# Patient Record
Sex: Male | Born: 1970
Health system: Southern US, Community
[De-identification: ages and names within clinical notes are randomized; demographics above are authoritative.]

## PROBLEM LIST (undated history)

## (undated) DIAGNOSIS — E785 Hyperlipidemia, unspecified: Secondary | ICD-10-CM

## (undated) DIAGNOSIS — M109 Gout, unspecified: Secondary | ICD-10-CM

## (undated) DIAGNOSIS — K219 Gastro-esophageal reflux disease without esophagitis: Secondary | ICD-10-CM

## (undated) DIAGNOSIS — G4730 Sleep apnea, unspecified: Secondary | ICD-10-CM

## (undated) DIAGNOSIS — D649 Anemia, unspecified: Secondary | ICD-10-CM

## (undated) DIAGNOSIS — F419 Anxiety disorder, unspecified: Secondary | ICD-10-CM

## (undated) HISTORY — DX: Gastro-esophageal reflux disease without esophagitis: K21.9

## (undated) HISTORY — DX: Sleep apnea, unspecified: G47.30

## (undated) HISTORY — PX: HERNIA REPAIR: SHX51

## (undated) HISTORY — DX: Anemia, unspecified: D64.9

## (undated) HISTORY — DX: Anxiety disorder, unspecified: F41.9

## (undated) HISTORY — PX: CERVICAL FUSION: SHX112

---

## 2005-07-12 ENCOUNTER — Ambulatory Visit: Payer: Self-pay | Admitting: Family Medicine

## 2005-07-31 ENCOUNTER — Ambulatory Visit: Payer: Self-pay | Admitting: Family Medicine

## 2009-04-28 ENCOUNTER — Ambulatory Visit: Payer: Self-pay | Admitting: Family Medicine

## 2009-04-28 DIAGNOSIS — M940 Chondrocostal junction syndrome [Tietze]: Secondary | ICD-10-CM

## 2009-04-29 ENCOUNTER — Encounter: Payer: Self-pay | Admitting: Family Medicine

## 2009-09-20 ENCOUNTER — Ambulatory Visit: Payer: Self-pay | Admitting: Family Medicine

## 2009-09-20 DIAGNOSIS — E785 Hyperlipidemia, unspecified: Secondary | ICD-10-CM

## 2010-01-15 ENCOUNTER — Ambulatory Visit: Payer: Self-pay | Admitting: Emergency Medicine

## 2010-05-08 NOTE — Assessment & Plan Note (Signed)
Summary: SALIVA GLAND SWOLLEN/TJ   Vital Signs:  Patient Profile:   40 Years Old Male CC:      sore mouth/swollen gland X 4 days Height:     68 inches Weight:      245 pounds O2 Sat:      97 % O2 treatment:    Room Air Temp:     97.9 degrees F oral Pulse rate:   72 / minute Resp:     14 per minute BP sitting:   107 / 76  (right arm) Cuff size:   large  Pt. in pain?   yes    Location:   inside left cheeck    Type:       sore  Vitals Entered By: Lajean Saver RN (September 20, 2009 9:43 AM)                   Updated Prior Medication List: ACID REDUCER 10 MG TABS (FAMOTIDINE)  TUMS ULTRA 1000 1000 MG CHEW (CALCIUM CARBONATE ANTACID)  MULTIVITAMINS  TABS (MULTIPLE VITAMIN) once daily * VITMAIN C 1000MG  once dialy  Current Allergies (reviewed today): ! PENICILLINHistory of Present Illness Chief Complaint: sore mouth/swollen gland X 4 days History of Present Illness: Swollen gland on the L side. it is swollen to touch. Started on Saturday and has progressed. He has had a blockage on thr R side before. No sore throat.  Current Problems: ABSCESS OF SALIVARY GLAND (ICD-527.3) HYPERLIPIDEMIA (ICD-272.4) COSTOCHONDRITIS (ICD-733.6)   Current Meds ACID REDUCER 10 MG TABS (FAMOTIDINE)  TUMS ULTRA 1000 1000 MG CHEW (CALCIUM CARBONATE ANTACID)  MULTIVITAMINS  TABS (MULTIPLE VITAMIN) once daily * VITMAIN C 1000MG  once dialy CEFUROXIME AXETIL 500 MG  TABS (CEFUROXIME AXETIL) 1 by mouth 2 times daily  REVIEW OF SYSTEMS Constitutional Symptoms      Denies fever, chills, night sweats, weight loss, weight gain, and fatigue.  Eyes       Denies change in vision, eye pain, eye discharge, glasses, contact lenses, and eye surgery. Ear/Nose/Throat/Mouth       Denies hearing loss/aids, change in hearing, ear pain, ear discharge, dizziness, frequent runny nose, frequent nose bleeds, sinus problems, sore throat, hoarseness, and tooth pain or bleeding.      Comments: sore mouth Respiratory     Denies dry cough, productive cough, wheezing, shortness of breath, asthma, bronchitis, and emphysema/COPD.  Cardiovascular       Denies murmurs, chest pain, and tires easily with exhertion.    Gastrointestinal       Denies stomach pain, nausea/vomiting, diarrhea, constipation, blood in bowel movements, and indigestion. Genitourniary       Denies painful urination, kidney stones, and loss of urinary control. Neurological       Denies paralysis, seizures, and fainting/blackouts. Musculoskeletal       Denies muscle pain, joint pain, joint stiffness, decreased range of motion, redness, swelling, muscle weakness, and gout.  Skin       Denies bruising, unusual mles/lumps or sores, and hair/skin or nail changes.  Psych       Denies mood changes, temper/anger issues, anxiety/stress, speech problems, depression, and sleep problems. Other Comments: Pain over L salivary glands   Past History:  Family History: Last updated: 01/14/2006 DM, stroke, HTN-GM, Mother Colon Ca in her Early 63`s, GM, GF  Social History: Last updated: 09/20/2009 [employer] for [employer].  HS degree.  Married to [place] with 3 children.    some ETOH, no drugs,  2 caffeine/day, no reg exercise.  Current Smoker 1/2PPD X 6-71mo  Risk Factors: Smoking Status: current (09/20/2009)  Past Medical History: Umbilical Hernia Repair  april 2005 Hyperlipidemia  Past Surgical History: Reviewed history from 04/28/2009 and no changes required. Umbilical hernia repair_  Family History: Reviewed history from 01/14/2006 and no changes required. 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  HS degree.  Married to [place] with 3 children.    some ETOH, no drugs,  2 caffeine/day, no reg exercise. Current Smoker 1/2PPD X 6-53mo Smoking Status:  current Physical Exam General appearance: well developed, well nourished,mild distress Head: normocephalic,  atraumatic Ears: normal, no lesions or deformities Oral/Pharynx: tongue normal, posterior pharynx without erythema or exudate swelling over the L parotid gland Neck: neck supple,  trachea midline, no masses Skin: no obvious rashes or lesions MSE: oriented to time, place, and person Assessment New Problems: ABSCESS OF SALIVARY GLAND (ICD-527.3) HYPERLIPIDEMIA (ICD-272.4)  infection of salivary gland  Patient Education: Patient and/or caregiver instructed in the following: rest fluids and Tylenol.  Plan New Medications/Changes: CEFUROXIME AXETIL 500 MG  TABS (CEFUROXIME AXETIL) 1 by mouth 2 times daily  #20 x 0, 09/20/2009, Hassan Rowan MD  New Orders: Est. Patient Level III [phone] Planning Comments:   if not turing around in 48 hrs need ENT follow uip.  Follow Up: Follow up in 2-3 days if no improvement Follow Up: ENT   The patient and/or caregiver has been counseled thoroughly with regard to medications prescribed including dosage, schedule, interactions, rationale for use, and possible side effects and they verbalize understanding.  Diagnoses and expected course of recovery discussed and will return if not improved as expected or if the condition worsens. Patient and/or caregiver verbalized understanding.  Prescriptions: CEFUROXIME AXETIL 500 MG  TABS (CEFUROXIME AXETIL) 1 by mouth 2 times daily  #20 x 0   Entered and Authorized by:   Hassan Rowan MD   Signed by:   Hassan Rowan MD on 09/20/2009   Method used:   Printed then faxed to ...       [address] [phone]* (retail)       [address] [place], [place]  [address]       Ph: [phone]       Fax: [phone]   RxID:   [id number]   Patient Instructions: 1)  Follow up w/ENT if not markedly improved by Friday.  2)  Warm soaks to area 3)  Tobacco is very bad for your health and your loved ones! You Should stop smoking!. 4)  Stop Smoking Tips: Choose a Quit date. Cut down before the Quit date. decide what you  will do as a substitute when you feel the urge to smoke(gum,toothpick,exercise). 5)  Take your antibiotic as prescribed until ALL of it is gone, but stop if you develop a rash or swelling and contact our office as soon as possible. 6)  Recommended remaining out of work for next 2 days  Orders Added: 1)  Est. Patient Level III [44034]

## 2010-05-08 NOTE — Letter (Signed)
Summary: Out of Work  [facility]  [address]   [place], [place] [address]   Phone: [phone]  Fax: [phone]    September 20, 2009   Employee:  [id number]    To Whom It May Concern:   For Medical reasons, please excuse the above named employee from work for the following dates:  Start:   06/15/20111  Return:   09/22/2009  If you need additional information, please feel free to contact our office.         Sincerely,    Hassan Rowan MD

## 2010-05-08 NOTE — Assessment & Plan Note (Signed)
Summary: sinus infection   Vital Signs:  Patient Profile:   40 Years Old Male CC:      Pressure in head and chst, ears hurt, runny nose x 4 days Height:     68 inches Weight:      237 pounds O2 Sat:      99 % O2 treatment:    Room Air Temp:     99.3 degrees F oral Pulse rate:   79 / minute Pulse rhythm:   regular Resp:     16 per minute BP sitting:   133 / 81  (left arm) Cuff size:   regular  Vitals Entered By: Emilio Math (January 15, 2010 8:21 AM)                  Current Allergies (reviewed today): ! PENICILLINHistory of Present Illness Chief Complaint: Pressure in head and chst, ears hurt, runny nose x 4 days History of Present Illness: Patient complains of onset of cold symptoms for 4 days.  They have been using OTC Zycam which is helping a little bit. + sore throat + cough No pleuritic pain No wheezing + nasal congestion + post-nasal drainage + sinus pain/pressure No itchy/red eyes + earache No hemoptysis No SOB + night chills/sweats No fever No nausea No vomiting No abdominal pain No diarrhea No skin rashes No fatigue No myalgias No headache   Current Meds ACID REDUCER 10 MG TABS (FAMOTIDINE)  TUMS ULTRA 1000 1000 MG CHEW (CALCIUM CARBONATE ANTACID)  CEPHALEXIN 500 MG CAPS (CEPHALEXIN) 1 by mouth two times a day for 10 days  REVIEW OF SYSTEMS Constitutional Symptoms      Denies fever, chills, night sweats, weight loss, weight gain, and fatigue.  Eyes       Denies change in vision, eye pain, eye discharge, glasses, contact lenses, and eye surgery. Ear/Nose/Throat/Mouth       Complains of ear pain, frequent runny nose, sinus problems, and sore throat.      Denies hearing loss/aids, change in hearing, ear discharge, dizziness, frequent nose bleeds, hoarseness, and tooth pain or bleeding.  Respiratory       Complains of dry cough.      Denies productive cough, wheezing, shortness of breath, asthma, bronchitis, and emphysema/COPD.   Cardiovascular       Denies murmurs, chest pain, and tires easily with exhertion.    Gastrointestinal       Denies stomach pain, nausea/vomiting, diarrhea, constipation, blood in bowel movements, and indigestion. Genitourniary       Denies painful urination, kidney stones, and loss of urinary control. Neurological       Complains of headaches.      Denies paralysis, seizures, and fainting/blackouts. Musculoskeletal       Denies muscle pain, joint pain, joint stiffness, decreased range of motion, redness, swelling, muscle weakness, and gout.  Skin       Denies bruising, unusual mles/lumps or sores, and hair/skin or nail changes.  Psych       Denies mood changes, temper/anger issues, anxiety/stress, speech problems, depression, and sleep problems.  Past History:  Past Medical History: Reviewed history from 09/20/2009 and no changes required. Umbilical Hernia Repair  april 2005 Hyperlipidemia  Past Surgical History: Reviewed history from 04/28/2009 and no changes required. Umbilical hernia repair_  Family History: Reviewed history from 01/14/2006 and no changes required. DM, stroke, HTN-GM, Mother Colon Ca in her Early 81`s, GM, GF  Social History: Reviewed history from 09/20/2009 and no changes required. 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  HS degree.  Married to [place] with 3 children.    some ETOH, no drugs,  2 caffeine/day, no reg exercise. Current Smoker 1/2PPD X 6-47mo Physical Exam General appearance: well developed, well nourished, no acute distress Head: normocephalic, atraumatic Ears: normal, no lesions or deformities Nasal: mucosa pink, nonedematous, no septal deviation, turbinates normal Oral/Pharynx: tongue normal, posterior pharynx without erythema or exudate Chest/Lungs: no rales, wheezes, or rhonchi bilateral, breath sounds equal without effort Heart: regular rate and  rhythm, no murmur Skin: no obvious rashes or lesions MSE: oriented to time, place, and  person Assessment New Problems: SINUSITIS, ACUTE (ICD-461.9)   Patient Education: Patient and/or caregiver instructed in the following: rest, fluids, Tylenol prn.  Plan New Medications/Changes: CEPHALEXIN 500 MG CAPS (CEPHALEXIN) 1 by mouth two times a day for 10 days  #20 x 0, 01/15/2010, Hoyt Koch MD  New Orders: Est. Patient Level II [phone] Planning Comments:   Suggest OTC Claritin-D because this is likely due to allergies Can use OTC Afrin for 2-3 days Follow-up with your primary care physician if not improving or if getting worse   The patient and/or caregiver has been counseled thoroughly with regard to medications prescribed including dosage, schedule, interactions, rationale for use, and possible side effects and they verbalize understanding.  Diagnoses and expected course of recovery discussed and will return if not improved as expected or if the condition worsens. Patient and/or caregiver verbalized understanding.  Prescriptions: CEPHALEXIN 500 MG CAPS (CEPHALEXIN) 1 by mouth two times a day for 10 days  #20 x 0   Entered and Authorized by:   Hoyt Koch MD   Signed by:   Hoyt Koch MD on 01/15/2010   Method used:   Print then Give to Patient   RxID:   [id number]   Orders Added: 1)  Est. Patient Level II [95621]

## 2010-05-08 NOTE — Assessment & Plan Note (Signed)
Summary: CHEST PAIN/KH   Vital Signs:  Patient Profile:   40 Years Old Male CC:      Chest Pain x 2 weeks, has reflux Height:     68 inches Weight:      256 pounds O2 Sat:      98 % O2 treatment:    Room Air Temp:     99.0 degrees F oral Pulse rate:   80 / minute Pulse rhythm:   regular Resp:     16 per minute BP sitting:   125 / 83  (right arm) Cuff size:   regular  Pt. in pain?   yes    Intensity:   6    Type:       dull  Vitals Entered By: Emilio Math (April 28, 2009 8:31 AM)                   Current Allergies: ! PENICILLINHistory of Present Illness Chief Complaint: Chest Pain x 2 weeks, has reflux History of Present Illness: Subjective:  Patient complains of generally constant ache in his anterior chest for about 2 weeks, worse with movement of his arms, and deep inspiration.  He denies cough, and has had no recent viral illness.  He recalls no recent injury to his chest or change in physical activities.  No fevers, chills, and sweats.  He states that he had a normal cardiac stress test two years ago.  He has a history of GERD, but states that those symptoms are completely different from his present conditon.  He states that the chest discomfort often awakens him at night because cannot find a comfortable position. He has a family history of stroke (maternal GM)  Current Meds TRICOR 48 MG TABS (FENOFIBRATE)  ACID REDUCER 10 MG TABS (FAMOTIDINE)  TUMS ULTRA 1000 1000 MG CHEW (CALCIUM CARBONATE ANTACID)  NAPROXEN 375 MG TABS (NAPROXEN) 1 by mouth q12hr pc TRAMADOL HCL 50 MG TABS (TRAMADOL HCL) One or two tabs by mouth hs as needed for pain  REVIEW OF SYSTEMS Constitutional Symptoms       Complains of weight gain.     Denies fever, chills, night sweats, weight loss, and fatigue.  Eyes       Denies change in vision, eye pain, eye discharge, glasses, contact lenses, and eye surgery. Ear/Nose/Throat/Mouth       Denies hearing loss/aids, change in hearing, ear pain,  ear discharge, dizziness, frequent runny nose, frequent nose bleeds, sinus problems, sore throat, hoarseness, and tooth pain or bleeding.  Respiratory       Denies dry cough, productive cough, wheezing, shortness of breath, asthma, bronchitis, and emphysema/COPD.  Cardiovascular       Complains of chest pain and tires easily with exhertion.      Denies murmurs.    Gastrointestinal       Complains of indigestion.      Denies stomach pain, nausea/vomiting, diarrhea, constipation, and blood in bowel movements. Genitourniary       Denies painful urination, kidney stones, and loss of urinary control. Neurological       Denies paralysis, seizures, and fainting/blackouts. Musculoskeletal       Denies muscle pain, joint pain, joint stiffness, decreased range of motion, redness, swelling, muscle weakness, and gout.  Skin       Denies bruising, unusual mles/lumps or sores, and hair/skin or nail changes.  Psych       Denies mood changes, temper/anger issues, anxiety/stress, speech problems, depression, and sleep problems.  Past History:  Past Medical History: Reviewed history from 01/14/2006 and no changes required. Umbilical Hernia Repair  april 2005  Past Surgical History: Umbilical hernia repair_  Family History: Reviewed history from 01/14/2006 and no changes required. DM, stroke, HTN-GM, Mother Colon Ca in her Early 59`s, GM, GF  Social History: Reviewed history from 01/14/2006 and no changes required. 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  HS degree.  Married to [place] with 3 children.  Occ smokes/uses snuff, some ETOH, no drugs, 2 caffeine/day, no reg exercise.   Objective:  No acute distress, alert and oriented  Eyes:  Pupils are equal, round, and reactive to light and accomdation.  Extraocular movement is intact.  Conjunctivae are not inflamed.  Pharynx:  Normal  Neck:  Supple.  No adenopathy is present.  No thyromegaly is present  Lungs:  Clear to auscultation.  Breath  sounds are equal.  Chest:  Distinct tenderness over the lower one-third of sternum extending into left anterior chest over the left pectoralis muscle. Heart:  Regular rate and rhythm without murmurs, rubs, or gallops.  Abdomen:  Nontender without masses or hepatosplenomegaly.  Bowel sounds are present.  No CVA or flank tenderness.  EKG:  normal Assessment New Problems: COSTOCHONDRITIS (ICD-733.6)   Plan New Medications/Changes: TRAMADOL HCL 50 MG TABS (TRAMADOL HCL) One or two tabs by mouth hs as needed for pain  #20 x 0, 04/28/2009, Donna Christen MD NAPROXEN 375 MG TABS (NAPROXEN) 1 by mouth q12hr pc  #20 x 0, 04/28/2009, Donna Christen MD  New Orders: New Patient Level III [phone] EKG w/ Interpretation [93000] Planning Comments:   Begin NSAID.  Analgesic for bedtime.  Apply heating pad 2 or 3 times daily. Follow-up with PCP if not improving 2 weeks.  Netter info sheet given   The patient and/or caregiver has been counseled thoroughly with regard to medications prescribed including dosage, schedule, interactions, rationale for use, and possible side effects and they verbalize understanding.  Diagnoses and expected course of recovery discussed and will return if not improved as expected or if the condition worsens. Patient and/or caregiver verbalized understanding.  Prescriptions: TRAMADOL HCL 50 MG TABS (TRAMADOL HCL) One or two tabs by mouth hs as needed for pain  #20 x 0   Entered and Authorized by:   Donna Christen MD   Signed by:   Donna Christen MD on 04/28/2009   Method used:   Print then Give to Patient   RxID:   [id number] NAPROXEN 375 MG TABS (NAPROXEN) 1 by mouth q12hr pc  #20 x 0   Entered and Authorized by:   Donna Christen MD   Signed by:   Donna Christen MD on 04/28/2009   Method used:   Print then Give to Patient   RxID:   [id number]

## 2010-06-26 ENCOUNTER — Encounter: Payer: Self-pay | Admitting: Family Medicine

## 2010-06-26 ENCOUNTER — Ambulatory Visit
Admission: RE | Admit: 2010-06-26 | Discharge: 2010-06-26 | Disposition: A | Discharge status: Home / Self Care | Payer: BC Managed Care – PPO | Source: Ambulatory Visit | Attending: Family Medicine | Admitting: Family Medicine

## 2010-06-26 ENCOUNTER — Inpatient Hospital Stay (INDEPENDENT_AMBULATORY_CARE_PROVIDER_SITE_OTHER)
Admission: RE | Admit: 2010-06-26 | Discharge: 2010-06-26 | Disposition: A | Discharge status: Home / Self Care | Payer: BC Managed Care – PPO | Source: Ambulatory Visit | Attending: Family Medicine | Admitting: Family Medicine

## 2010-06-26 ENCOUNTER — Other Ambulatory Visit: Payer: Self-pay | Admitting: Family Medicine

## 2010-06-26 DIAGNOSIS — R05 Cough: Secondary | ICD-10-CM

## 2010-06-26 DIAGNOSIS — J209 Acute bronchitis, unspecified: Secondary | ICD-10-CM

## 2010-06-26 DIAGNOSIS — M940 Chondrocostal junction syndrome [Tietze]: Secondary | ICD-10-CM

## 2010-06-27 ENCOUNTER — Encounter: Payer: Self-pay | Admitting: Family Medicine

## 2010-07-05 NOTE — Letter (Signed)
Summary: Handout Printed  Printed Handout:  - Rheumatic Fever 

## 2010-07-05 NOTE — Assessment & Plan Note (Signed)
Summary: COUGH,SORE THROAT, SOB,CHEST PAIN,SINUS PROBLEMS/TJ rm 4   Vital Signs:  Patient Profile:   40 Years Old Male CC:      chest hurts, cough Height:     68 inches Weight:      252 pounds O2 Sat:      98 % O2 treatment:    Room Air Temp:     99.3 degrees F oral Pulse rate:   96 / minute Resp:     18 per minute BP sitting:   122 / 79  (left arm) Cuff size:   large  Vitals Entered By: Clemens Catholic LPN (June 26, 2010 4:03 PM)                  Updated Prior Medication List: CENTRUM  TABS (MULTIPLE VITAMINS-MINERALS)  Vitamin C Current Allergies: ! PENICILLINHistory of Present Illness Chief Complaint: chest hurts, cough History of Present Illness:  Subjective: Patient complains of persistent sinus congestion for several weeks, then one week ago developed sudden onset myalgias, lethargy, fever/sweats, headache. + cough, partially productive No pleuritic pain, but has pain over the sternum, worse with cough.  This sternal pain has been present for about 2 months. No wheezing + nasal congestion ? post-nasal drainage No sinus pain/pressure No itchy/red eyes No earache but ears feel clogged No hemoptysis No SOB + fever/chills No nausea No vomiting No abdominal pain No diarrhea No skin rashes + fatigue + myalgias  Used OTC meds without relief   REVIEW OF SYSTEMS Constitutional Symptoms       Complains of fever, chills, night sweats, and fatigue.     Denies weight loss and weight gain.  Eyes       Denies change in vision, eye pain, eye discharge, glasses, contact lenses, and eye surgery. Ear/Nose/Throat/Mouth       Complains of frequent runny nose, sinus problems, sore throat, and hoarseness.      Denies hearing loss/aids, change in hearing, ear pain, ear discharge, dizziness, frequent nose bleeds, and tooth pain or bleeding.  Respiratory       Complains of productive cough, wheezing, and shortness of breath.      Denies dry cough, asthma, bronchitis, and  emphysema/COPD.  Cardiovascular       Complains of chest pain and tires easily with exhertion.      Denies murmurs.    Gastrointestinal       Denies stomach pain, nausea/vomiting, diarrhea, constipation, blood in bowel movements, and indigestion. Genitourniary       Denies painful urination, kidney stones, and loss of urinary control. Neurological       Denies paralysis, seizures, and fainting/blackouts. Musculoskeletal       Denies muscle pain, joint pain, joint stiffness, decreased range of motion, redness, swelling, muscle weakness, and gout.  Skin       Denies bruising, unusual mles/lumps or sores, and hair/skin or nail changes.  Psych       Denies mood changes, temper/anger issues, anxiety/stress, speech problems, depression, and sleep problems. Other Comments: pt states that his chest has been hurting in the center through to his back for . he had a sinus infection ago and still has a mostly dry cough and fever. he would like to have a chest xray performed.   Past History:  Past Medical History:  Hyperlipidemia  Past Surgical History: Umbilical hernia repair_2005  Family History: Reviewed history from 01/14/2006 and no changes required. DM, stroke, HTN-GM, Mother Colon Ca in her Early 40`s, GM,  GF  Social History: Reviewed history from 09/20/2009 and no changes required. 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  HS degree.  Married to [place] with 3 children.    some ETOH, no drugs,  2 caffeine/day, no reg exercise. Current Smoker 1/2PPD X 6-44mo   Objective:  No acute distress  Eyes:  Pupils are equal, round, and reactive to light and accomdation.  Extraocular movement is intact.  Conjunctivae are not inflamed.  Ears:  Canals normal.  Tympanic membranes normal, but there may be a mild clear effusion behind left tympanic membrane  Nose:  Congested turbinates.  No sinus tenderness Pharynx:  Normal  Neck:  Supple.  No adenopathy is present.  Lungs:  Clear to  auscultation.  Breath sounds are equal.  Chest:  Distinct tenderness over mid-sternum Heart:  Regular rate and rhythm without murmurs, rubs, or gallops.  Abdomen:  Nontender without masses or hepatosplenomegaly.  Bowel sounds are present.  No CVA or flank tenderness.  Extremities:  No edema. CBC:  WBC 5.7; normal diff; Hgb 13.2 Chest X-ray:  IMPRESSION: No active cardiopulmonary process. Assessment  Assessed COSTOCHONDRITIS as deteriorated - Donna Christen MD New Problems: ACUTE BRONCHITIS (ICD-466.0) COUGH (ICD-786.2)   Plan New Medications/Changes: BENZONATATE 200 MG CAPS (BENZONATATE) One by mouth hs as needed cough  #12 x 0, 06/26/2010, Donna Christen MD NAPROXEN 500 MG TABS (NAPROXEN) One by mouth two times a day pc for chest pain  #20 x 0, 06/26/2010, Donna Christen MD CLARITHROMYCIN 500 MG TABS (CLARITHROMYCIN) One Tab by mouth two times a day  #20 x 0, 06/26/2010, Donna Christen MD  New Orders: CBC w/Diff [[id number]] T-Chest x-ray, 2 views [71020] Pulse Oximetry (single measurment) [94760] Est. Patient Level IV [10272] Planning Comments:   Begin Biaxin, expectorant/decongestant, cough suppressant at bedtime.  Increase fluid intake Begin Naproxen for sternum discomfort.  Given a Water quality scientist patient information and instruction sheet on topic costochondritis Followup with PCP if not improving 7 to 10 days   The patient and/or caregiver has been counseled thoroughly with regard to medications prescribed including dosage, schedule, interactions, rationale for use, and possible side effects and they verbalize understanding.  Diagnoses and expected course of recovery discussed and will return if not improved as expected or if the condition worsens. Patient and/or caregiver verbalized understanding.  Prescriptions: BENZONATATE 200 MG CAPS (BENZONATATE) One by mouth hs as needed cough  #12 x 0   Entered and Authorized by:   Donna Christen MD   Signed by:   Donna Christen MD on 06/26/2010    Method used:   Print then Give to Patient   RxID:   [id number] NAPROXEN 500 MG TABS (NAPROXEN) One by mouth two times a day pc for chest pain  #20 x 0   Entered and Authorized by:   Donna Christen MD   Signed by:   Donna Christen MD on 06/26/2010   Method used:   Print then Give to Patient   RxID:   [id number] CLARITHROMYCIN 500 MG TABS (CLARITHROMYCIN) One Tab by mouth two times a day  #20 x 0   Entered and Authorized by:   Donna Christen MD   Signed by:   Donna Christen MD on 06/26/2010   Method used:   Print then Give to Patient   RxID:   [id number]   Patient Instructions: 1)  Take Mucinex D (guaifenesin with decongestant) twice daily for congestion. 2)  Increase fluid intake, rest. 3)  May use Afrin nasal spray (or generic oxymetazoline) twice daily  for about 5 days.  Also recommend using saline nasal spray several times daily and/or saline nasal irrigation. 4)  Followup with family doctor if not improving 7 to 10 days.   Orders Added: 1)  CBC w/Diff [[id number]] 2)  T-Chest x-ray, 2 views [71020] 3)  Pulse Oximetry (single measurment) [94760] 4)  Est. Patient Level IV [09811]    Laboratory Results  Date/Time Received: June 26, 2010 4:18 PM  Date/Time Reported: June 26, 2010 4:18 PM   Other Tests  Rapid Strep: negative  Kit Test Internal QC: Negative   (Normal Range: Negative)

## 2011-01-15 ENCOUNTER — Inpatient Hospital Stay (INDEPENDENT_AMBULATORY_CARE_PROVIDER_SITE_OTHER)
Admission: RE | Admit: 2011-01-15 | Discharge: 2011-01-15 | Disposition: A | Discharge status: Home / Self Care | Payer: BC Managed Care – PPO | Source: Ambulatory Visit | Attending: Family Medicine | Admitting: Family Medicine

## 2011-01-15 ENCOUNTER — Other Ambulatory Visit: Payer: Self-pay | Admitting: Family Medicine

## 2011-01-15 ENCOUNTER — Encounter: Payer: Self-pay | Admitting: Family Medicine

## 2011-01-15 ENCOUNTER — Ambulatory Visit
Admission: RE | Admit: 2011-01-15 | Discharge: 2011-01-15 | Disposition: A | Discharge status: Home / Self Care | Payer: BC Managed Care – PPO | Source: Ambulatory Visit | Attending: Family Medicine | Admitting: Family Medicine

## 2011-01-15 ENCOUNTER — Telehealth (INDEPENDENT_AMBULATORY_CARE_PROVIDER_SITE_OTHER): Payer: Self-pay | Admitting: *Deleted

## 2011-01-15 DIAGNOSIS — M546 Pain in thoracic spine: Secondary | ICD-10-CM | POA: Insufficient documentation

## 2011-01-15 DIAGNOSIS — M542 Cervicalgia: Secondary | ICD-10-CM

## 2011-01-15 DIAGNOSIS — R209 Unspecified disturbances of skin sensation: Secondary | ICD-10-CM | POA: Insufficient documentation

## 2011-01-15 DIAGNOSIS — M5412 Radiculopathy, cervical region: Secondary | ICD-10-CM

## 2011-03-11 NOTE — Telephone Encounter (Signed)
  Phone Note Outgoing Call   Call placed by: Lajean Saver RN,  January 15, 2011 12:49 PM Call placed to: [facility] Action Taken: Phone Call Completed, Appt scheduled Summary of Call: Referral made per Dr. Rolla Plate request to [facility]. Apt made for 01/21/11 @ 2:30 with Dr. Carole Binning. Patient notified.  Initial call taken by: Lajean Saver RN,  January 15, 2011 12:50 PM

## 2011-03-11 NOTE — Progress Notes (Signed)
Summary: back pain/wb (rm 5)   Vital Signs:  Patient Profile:   40 Years Old Male CC:      upper back pain and right hand numbness x 2 weeks Height:     68 inches Weight:      249 pounds O2 Sat:      97 % O2 treatment:    Room Air Temp:     98.4 degrees F oral Pulse rate:   86 / minute Resp:     16 per minute BP sitting:   138 / 81  (left arm) Cuff size:   large  Pt. in pain?   yes    Location:   upper back    Intensity:   8    Type:       burn/ache  Vitals Entered By: Lajean Saver RN (January 15, 2011 10:49 AM)                   Updated Prior Medication List: CENTRUM  TABS (MULTIPLE VITAMINS-MINERALS)   Current Allergies (reviewed today): ! PENICILLINHistory of Present Illness Chief Complaint: upper back pain and right hand numbness x 2 weeks History of Present Illness:  Subjective:  Patient complains of 2 week history of pain in his right posterior neck and right upper shoulder blade area.  He states that his neck is stiff and he often has a grinding sensation when rotating his neck.  Over the past 3 days he has had a constant tingling sensation in his left thumb and second finger.  He also feels that he is losing strength in his right hand.  He is left-handed.  He is having difficulty sleeping at night.  No cough or chest pain.  No history of neck trauma.  REVIEW OF SYSTEMS Constitutional Symptoms      Denies fever, chills, night sweats, weight loss, weight gain, and fatigue.  Eyes       Denies change in vision, eye pain, eye discharge, glasses, contact lenses, and eye surgery. Ear/Nose/Throat/Mouth       Denies hearing loss/aids, change in hearing, ear pain, ear discharge, dizziness, frequent runny nose, frequent nose bleeds, sinus problems, sore throat, hoarseness, and tooth pain or bleeding.  Respiratory       Denies dry cough, productive cough, wheezing, shortness of breath, asthma, bronchitis, and emphysema/COPD.  Cardiovascular       Denies murmurs, chest  pain, and tires easily with exhertion.    Gastrointestinal       Denies stomach pain, nausea/vomiting, diarrhea, constipation, blood in bowel movements, and indigestion. Genitourniary       Denies painful urination, kidney stones, and loss of urinary control. Neurological       Complains of numbness and tingling.      Denies paralysis, seizures, and fainting/blackouts. Musculoskeletal       Complains of muscle pain.      Denies joint pain, joint stiffness, decreased range of motion, redness, swelling, muscle weakness, and gout.  Skin       Denies bruising, unusual mles/lumps or sores, and hair/skin or nail changes.  Psych       Denies mood changes, temper/anger issues, anxiety/stress, speech problems, depression, and sleep problems. Other Comments: Patient c/o upper right sided back pain x 2 weeks. Nubness in right hand/fingers developing. Taken ibuprofen and tylenol OTC. Unknown cause of pain   Past History:  Past Medical History: Reviewed history from 06/26/2010 and no changes required.  Hyperlipidemia  Past Surgical History: Reviewed history  from 06/26/2010 and no changes required. Umbilical hernia repair_2005  Family History: Reviewed history from 01/14/2006 and no changes required. DM, stroke, HTN-GM, Mother Colon Ca in her Early 62`s, GM, GF  Social History: Reviewed history from 06/26/2010 and no changes required. 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  HS degree.  Married to [place] with 3 children.    some ETOH, no drugs,  2 caffeine/day, no reg exercise. Current Smoker 1/2PPD X 6-56mo   Objective:  Husky middle aged male in no acute distress  Eyes:  Pupils are equal, round, and reactive to light and accomodation.  Extraocular movement is intact.  Conjunctivae are not inflamed.  Neck:  Supple.  No adenopathy is present.  No thyromegaly is present.  Mildly decreased range of motion.  No tenderness to palpation Lungs:  Clear to auscultation.  Breath sounds are equal.  Heart:   Regular rate and rhythm without murmurs, rubs, or gallops.  Back (upper):  No tenderness over right scapula Right arm:  Full range of motion all joints.  No shoulder, elbow, wrist, or hand tenderness.  Right thumb and second finger have normal sensation to light touch.   Decreased grip strength right hand. X-ray C-spine:   IMPRESSION: No acute bony abnormality.  Right foraminal narrowing, most prominent at C3-4.  Assessment New Problems: CERVICAL RADICULOPATHY (ICD-723.4) DISTURBANCE OF SKIN SENSATION (ICD-782.0) BACK PAIN, THORACIC REGION, RIGHT (ICD-724.1) NECK PAIN, RIGHT (ICD-723.1)   Plan New Medications/Changes: TRAMADOL HCL 50 MG TABS (TRAMADOL HCL) One or two tabs by mouth hs as needed for pain  #20 x 0, 01/15/2011, Donna Christen MD PREDNISONE 10 MG TABS (PREDNISONE) 2 PO BID for 3 days, then 1 BID for 2 days, then 1 daily for 2 days.  Take PC  #18 x 0, 01/15/2011, Donna Christen MD  New Orders: T-DG Cervical Spine Complete 5 views [72050] Est. Patient Level IV [16109] Neurology Referral [Neuro] Planning Comments:   Begin tapering course of prednisone.  Tramadol at bedtime. Refer to neurologist:  [facility] in [place]   The patient and/or caregiver has been counseled thoroughly with regard to medications prescribed including dosage, schedule, interactions, rationale for use, and possible side effects and they verbalize understanding.  Diagnoses and expected course of recovery discussed and will return if not improved as expected or if the condition worsens. Patient and/or caregiver verbalized understanding.  Prescriptions: TRAMADOL HCL 50 MG TABS (TRAMADOL HCL) One or two tabs by mouth hs as needed for pain  #20 x 0   Entered and Authorized by:   Donna Christen MD   Signed by:   Donna Christen MD on 01/15/2011   Method used:   Print then Give to Patient   RxID:   [id number] PREDNISONE 10 MG TABS (PREDNISONE) 2 PO BID for 3 days, then 1 BID for 2 days, then 1  daily for 2 days.  Take PC  #18 x 0   Entered and Authorized by:   Donna Christen MD   Signed by:   Donna Christen MD on 01/15/2011   Method used:   Print then Give to Patient   RxID:   [id number]   Orders Added: 1)  T-DG Cervical Spine Complete 5 views [72050] 2)  Est. Patient Level IV [29528] 3)  Neurology Referral [Neuro]

## 2011-08-12 ENCOUNTER — Encounter: Payer: Self-pay | Admitting: *Deleted

## 2011-08-12 ENCOUNTER — Emergency Department
Admission: EM | Admit: 2011-08-12 | Discharge: 2011-08-12 | Disposition: A | Discharge status: Home / Self Care | Payer: BC Managed Care – PPO | Source: Home / Self Care

## 2011-08-12 DIAGNOSIS — R05 Cough: Secondary | ICD-10-CM

## 2011-08-12 DIAGNOSIS — J329 Chronic sinusitis, unspecified: Secondary | ICD-10-CM

## 2011-08-12 HISTORY — DX: Hyperlipidemia, unspecified: E78.5

## 2011-08-12 MED ORDER — ALBUTEROL SULFATE HFA 108 (90 BASE) MCG/ACT IN AERS: 1.0000 | INHALATION_SPRAY | Freq: Four times a day (QID) | RESPIRATORY_TRACT | Status: DC | PRN

## 2011-08-12 MED ORDER — AZITHROMYCIN 250 MG PO TABS: ORAL_TABLET | ORAL | Status: AC

## 2011-08-12 NOTE — ED Provider Notes (Signed)
Agree with exam, assessment, and plan.   Lattie Haw, MD 08/12/11 [phone]

## 2011-08-12 NOTE — Discharge Instructions (Signed)

## 2011-08-12 NOTE — ED Provider Notes (Signed)
History     CSN: [id number]  Arrival date & time 08/12/11  1257   None     Chief Complaint  Patient presents with  . Sinus Problem    (Consider location/radiation/quality/duration/timing/severity/associated sxs/prior treatment) Patient is a 41 y.o. male presenting with sinus complaint.  Sinus Problem   Dakota Garcia is a 41 y.o. male who complains of onset of uri for 2 weeks, states three days ago began to have facial pain/headache, left side worse than right.  No sore throat + cough, non productive No pleuritic pain + wheezing No nasal congestion + post-nasal drainage + sinus pain/pressure No chest congestion No itchy/red eyes L earache No hemoptysis No SOB + chills/sweats No fever No nausea No vomiting No abdominal pain No diarrhea No skin rashes No fatigue No myalgias No headache   Past Medical History  Diagnosis Date  . Hyperlipidemia     Past Surgical History  Procedure Date  . Hernia repair   . Cervical fusion     No family history on file.  History  Substance Use Topics  . Smoking status: Never Smoker   . Smokeless tobacco: Not on file  . Alcohol Use: Yes      Review of Systems  All other systems reviewed and are negative.    Allergies  Penicillins  Home Medications   Current Outpatient Rx  Name Route Sig Dispense Refill  . OMEGA-3 FATTY ACIDS 1000 MG PO CAPS Oral Take 2 g by mouth daily.    . ALBUTEROL SULFATE HFA 108 (90 BASE) MCG/ACT IN AERS Inhalation Inhale 1-2 puffs into the lungs every 6 (six) hours as needed for wheezing. 1 Inhaler 0  . AZITHROMYCIN 250 MG PO TABS  Azithromycin 500mg  on day 1, then 250mg  on days 2-4  (RX expires 08/27/11) 6 tablet 0    BP 125/84  Pulse 107  Temp(Src) 98.8 F (37.1 C) (Oral)  Resp 16  Ht 5\' 8"  (1.727 m)  Wt 258 lb (117.028 kg)  BMI 39.23 kg/m2  SpO2 97%  Physical Exam  Constitutional: He is oriented to person, place, and time. Vital signs are normal. He appears well-developed and  well-nourished. He is active and cooperative.  HENT:  Head: Normocephalic.  Right Ear: Hearing, tympanic membrane, external ear and ear canal normal.  Left Ear: Hearing, external ear and ear canal normal. Tympanic membrane is bulging.  Nose: Nose normal. Right sinus exhibits no maxillary sinus tenderness and no frontal sinus tenderness. Left sinus exhibits no maxillary sinus tenderness and no frontal sinus tenderness.  Mouth/Throat: Uvula is midline, oropharynx is clear and moist and mucous membranes are normal.  Eyes: Conjunctivae and EOM are normal. Pupils are equal, round, and reactive to light. No scleral icterus.  Neck: Trachea normal. Neck supple.  Cardiovascular: Normal rate, regular rhythm, normal heart sounds and normal pulses.   Pulmonary/Chest: Effort normal and breath sounds normal. He exhibits no tenderness.  Lymphadenopathy:    He has no cervical adenopathy.       Left submandibular and tonsillar tenderness  Neurological: He is alert and oriented to person, place, and time. No cranial nerve deficit or sensory deficit.  Skin: Skin is warm and dry.  Psychiatric: He has a normal mood and affect. His speech is normal and behavior is normal. Judgment and thought content normal. Cognition and memory are normal.    ED Course  Procedures (including critical care time)  Labs Reviewed - No data to display No results found.   1. Sinusitis  2. Cough       MDM  Increase fluid intake, rest.  Begin Azithromycin if symptoms not improved with discussed measures in 3-4 days.  Begin expectorant/decongestant, topical decongestant, saline nasal spray and/or saline irrigation, and cough suppressant at bedtime. Antihistamines of your choice (Claritin or Zyrtec) in 3 days.  Tylenol or Motrin for fever/discomfort.  I believe you may have early bronchitis, began Albuterol for wheezing and cough.          Johnsie Kindred, NP 08/12/11 1337

## 2011-08-12 NOTE — ED Notes (Signed)
Sinus pressure and drainage x 2 weeks. Used sudafed and mucinex

## 2011-12-06 ENCOUNTER — Emergency Department
Admission: EM | Admit: 2011-12-06 | Discharge: 2011-12-06 | Disposition: A | Discharge status: Home / Self Care | Payer: BC Managed Care – PPO | Source: Home / Self Care

## 2011-12-06 ENCOUNTER — Encounter: Payer: Self-pay | Admitting: *Deleted

## 2011-12-06 ENCOUNTER — Emergency Department (INDEPENDENT_AMBULATORY_CARE_PROVIDER_SITE_OTHER): Payer: BC Managed Care – PPO

## 2011-12-06 ENCOUNTER — Ambulatory Visit (INDEPENDENT_AMBULATORY_CARE_PROVIDER_SITE_OTHER): Payer: BC Managed Care – PPO | Admitting: Sports Medicine

## 2011-12-06 DIAGNOSIS — M79609 Pain in unspecified limb: Secondary | ICD-10-CM

## 2011-12-06 DIAGNOSIS — M79672 Pain in left foot: Secondary | ICD-10-CM

## 2011-12-06 DIAGNOSIS — M25579 Pain in unspecified ankle and joints of unspecified foot: Secondary | ICD-10-CM

## 2011-12-06 DIAGNOSIS — M79673 Pain in unspecified foot: Secondary | ICD-10-CM

## 2011-12-06 DIAGNOSIS — M109 Gout, unspecified: Secondary | ICD-10-CM | POA: Insufficient documentation

## 2011-12-06 DIAGNOSIS — M7730 Calcaneal spur, unspecified foot: Secondary | ICD-10-CM

## 2011-12-06 MED ORDER — PREDNISONE 50 MG PO TABS: ORAL_TABLET | ORAL | Status: DC

## 2011-12-06 NOTE — ED Notes (Signed)
Patient c/o left foot pain x 3 days. No injury. Hx of possible gout.

## 2011-12-06 NOTE — ED Provider Notes (Signed)
History     CSN: [id number]  Arrival date & time 12/06/11  1045   First MD Initiated Contact with Patient 12/06/11 1108      Chief Complaint  Patient presents with  . Foot Pain    left foot   HPI  Patient presents today with chief complaint of left foot and ankle pain x2-3 days. Patient is unsure of any known inciting trauma for this. Patient does report that he has a prior history of left great toe podagra in the past. This incident occurred about 1-2 years ago. Patient states that he has noticed lateral ankle and lateral foot pain initially while at work. And this has persisted. Patient denies any numbness or tingling associated with this pain. Patient states he's been able to bear weight on his foot with mild pain. However, pain has progressively worse the past 2 days. Mild swelling and redness. Patient states that this current foot and ankle pain is not similar to his previous episode of podagra, but he is unsure if this is related to his gout or not. Patient states that he is not physically active. Patient does report standing on his feet all day. Patient states that he did eat barbecue pork last night and woke up this morning with significantly worsening lateral ankle and foot pain.   Past Medical History  Diagnosis Date  . Hyperlipidemia     Past Surgical History  Procedure Date  . Hernia repair   . Cervical fusion     Family History  Problem Relation Age of Onset  . Cancer Mother     colon  . Gout Father     History  Substance Use Topics  . Smoking status: Former Games developer  . Smokeless tobacco: Not on file  . Alcohol Use: Yes      Review of Systems  All other systems reviewed and are negative.    Allergies  Penicillins  Home Medications   Current Outpatient Rx  Name Route Sig Dispense Refill  . ALBUTEROL SULFATE HFA 108 (90 BASE) MCG/ACT IN AERS Inhalation Inhale 1-2 puffs into the lungs every 6 (six) hours as needed for wheezing. 1 Inhaler 0  . OMEGA-3  FATTY ACIDS 1000 MG PO CAPS Oral Take 2 g by mouth daily.      BP 116/84  Pulse 95  Temp 98 F (36.7 C) (Oral)  Resp 14  Ht 5\' 9"  (1.753 m)  Wt 253 lb (114.76 kg)  BMI 37.36 kg/m2  SpO2 97%  Physical Exam  Constitutional: He appears well-developed.       Obese    HENT:  Head: Normocephalic and atraumatic.  Eyes: Conjunctivae are normal. Pupils are equal, round, and reactive to light.  Neck: Normal range of motion.  Cardiovascular: Normal rate and regular rhythm.   Pulmonary/Chest: Effort normal and breath sounds normal.  Abdominal: Soft. Bowel sounds are normal.  Musculoskeletal:       Ankle: Mild lateral ankle swelling.  + tenderness over N spot and lateral malleolar distribution.  + Pain with plantar flexion, inversion, eversion.  Strength is 5/5 in all directions. Stable lateral and medial ligaments; squeeze test and kleiger test unremarkable;  Talar dome nontender; No pain at base of 5th MT; No tenderness over cuboid; No sign of peroneal tendon subluxations; Negative tarsal tunnel tinel's Able to walk 4 steps. Noted TTP over plantar aspect 4th-5th metatarsal base.      ED Course  Procedures (including critical care time)  Labs Reviewed - No data  to display Dg Ankle Complete Left  12/06/2011  *RADIOLOGY REPORT*  Clinical Data: Left foot and ankle pain.  No known injury.  LEFT ANKLE COMPLETE - 3+ VIEW  Comparison: Left foot radiographs obtained at the same time.  Findings: Previously described posterior calcaneal spur. Otherwise, normal appearing bones and soft tissues.  IMPRESSION: Posterior calcaneal spur.  Otherwise, normal examination.   Original Report Authenticated By: Darrol Angel, M.D.    Dg Foot Complete Left  12/06/2011  *RADIOLOGY REPORT*  Clinical Data: Left foot and ankle pain.  No known injury.  LEFT FOOT - COMPLETE 3+ VIEW  Comparison: None.  Findings: Small to moderate sized posterior calcaneal spur. Otherwise, normal appearing bones and soft  tissues.  IMPRESSION: Posterior calcaneal spur.  Otherwise, normal examination.   Original Report Authenticated By: Darrol Angel, M.D.      1. Foot pain   2. Ankle pain       MDM  While this is atypical, I suspect this may be a gout flare. Patient does have a prior history of gout as well as a worsening of pain after eating a meal rich in uric acid (barbecue pork). Will place patient on course of prednisone for diagnostic and therapeutic evaluation. Differential diagnosis also includes metatarsalgia, arthritis, and Morton's neuroma. X-rays are negative for any type of fracture although there is some degenerative changes along the talar dome. Plan for patient to followup with sports medicine for reevaluation the next 1-2 weeks. Discussed with patient general red flags for reevaluation including worsening pain, fever, chills. Sports medicine was also formally consulted for evaluation during this visit.     The patient and/or caregiver has been counseled thoroughly with regard to treatment plan and/or medications prescribed including dosage, schedule, interactions, rationale for use, and possible side effects and they verbalize understanding. Diagnoses and expected course of recovery discussed and will return if not improved as expected or if the condition worsens. Patient and/or caregiver verbalized understanding.              Doree Albee, MD 12/06/11 [phone]

## 2011-12-06 NOTE — Assessment & Plan Note (Signed)
I think that certainly possible he is having a flare of gout in his intertarsal joints. This would not be a classic location. 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 I do agree with Dr. Alvester Morin with a course of prednisone. He can come back to see me afterwards. We did discuss custom orthotics, and as he does spend all day on his feet, I think these would be useful.

## 2011-12-06 NOTE — Progress Notes (Signed)
Patient ID: Dakota Garcia, male   DOB: Nov 28, 1970, 41 y.o.   MRN: [id number] Subjective:    I'm seeing this patient as a consultation for:  Dr. Alvester Morin  CC: Left foot pain  HPI: Bishoy is a very pleasant 41 year old male who, without trauma, and for the past few days has noted significant pain over the dorsum of his left foot. He's also noted a small amount of swelling. The pain is localized, does not radiate, and he notes the does not feel like prior episodes of gout. He does note that he had some insoles at some point, that were particularly efficacious for his pain.  He does not have a primary physician, and he doesn't have anyone as follows his gout. He is unaware of his previous uric acid levels.  Past medical history, Surgical history, Family history, Social history, Allergies, and medications have been entered into the medical record, reviewed, and no changes needed.   Review of Systems: No headache, visual changes, nausea, vomiting, diarrhea, constipation, dizziness, abdominal pain, skin rash, fevers, chills, night sweats, weight loss, body aches, joint swelling, muscle aches, chest pain, or shortness of breath.   Objective:   Vitals:  Afebrile, vital signs stable. General: Well Developed, well nourished, and in no acute distress.  Neuro: Alert and oriented x3, extra-ocular muscles intact.  Skin: Warm and dry, no rashes noted.  Respiratory: Not using accessory muscles, speaking in full sentences.  Cardiovascular: Pulses palpable, no extremity edema. Left Ankle: No visible erythema or swelling. Range of motion is full in all directions. Strength is 5/5 in all directions. Stable lateral and medial ligaments; squeeze test and kleiger test unremarkable; Talar dome nontender; No pain at base of 5th MT;  No tenderness over N spot or navicular prominence No tenderness on posterior aspects of lateral and medial malleolus No sign of peroneal tendon subluxations or tenderness to  palpation Negative tarsal tunnel tinel's He is exquisitely tender to palpation over what feels like the extensor digitorum longus tendon, at the level of the metatarsal cuboid joint. He has pain with resisted eversion, and pain with passive flexion of toes 2 through 5.  I did review x-rays of his ankle and foot personally, they show no sign of fracture, or dislocation. There is no degenerative change noted at the metatarsal cuboid joint. There is possibly some slight degenerative change at the tibiotalar joint. Otherwise unremarkable.  Impression and Recommendations:

## 2011-12-13 ENCOUNTER — Ambulatory Visit: Payer: BC Managed Care – PPO | Admitting: Sports Medicine

## 2011-12-13 ENCOUNTER — Telehealth: Payer: Self-pay | Admitting: Sports Medicine

## 2011-12-13 MED ORDER — MELOXICAM 15 MG PO TABS: ORAL_TABLET | ORAL | Status: AC

## 2011-12-13 NOTE — Telephone Encounter (Signed)
Pain inadequately controlled with prednisone. We'll add Mobic. He will come to see me next week, at that point I suspect we will have to perform an intra-articular injection.

## 2011-12-13 NOTE — Telephone Encounter (Signed)
PT WOULD LIKE MOBIC CALLED IN TO [facility] IN [place].

## 2011-12-16 ENCOUNTER — Ambulatory Visit (INDEPENDENT_AMBULATORY_CARE_PROVIDER_SITE_OTHER): Payer: BC Managed Care – PPO | Admitting: Sports Medicine

## 2011-12-16 ENCOUNTER — Encounter: Payer: Self-pay | Admitting: Sports Medicine

## 2011-12-16 VITALS — BP 127/79 | HR 103 | Temp 97.3°F | Wt 254.0 lb

## 2011-12-16 DIAGNOSIS — M79672 Pain in left foot: Secondary | ICD-10-CM

## 2011-12-16 DIAGNOSIS — G4730 Sleep apnea, unspecified: Secondary | ICD-10-CM

## 2011-12-16 DIAGNOSIS — M79609 Pain in unspecified limb: Secondary | ICD-10-CM

## 2011-12-16 DIAGNOSIS — E785 Hyperlipidemia, unspecified: Secondary | ICD-10-CM

## 2011-12-16 DIAGNOSIS — R269 Unspecified abnormalities of gait and mobility: Secondary | ICD-10-CM

## 2011-12-16 MED ORDER — RED YEAST RICE EXTRACT POWD: Status: DC

## 2011-12-16 MED ORDER — PLANT STEROLS AND STANOLS 450 MG PO TABS: ORAL_TABLET | ORAL | Status: DC

## 2011-12-16 NOTE — Assessment & Plan Note (Signed)
He will come back to see me for custom orthotics.

## 2011-12-16 NOTE — Assessment & Plan Note (Signed)
Symptoms are likely related to gout. Will have him check a uric acid. Goal will be less than 5.

## 2011-12-16 NOTE — Assessment & Plan Note (Signed)
Continue diet modification, red rice yeast extract, and plant sterols/stanols We will go ahead and check a fasting lipid panel.

## 2011-12-16 NOTE — Progress Notes (Addendum)
Patient ID: Dakota Garcia, male   DOB: Aug 19, 1970, 41 y.o.   MRN: [id number] Subjective:    CC: Establish care, followup ankle pain  HPI: Ankle pain: Seen in the urgent care, as the consult. He was placed on prednisone, this resolved his symptoms. Presumptive diagnosis was gout.  He's had a prior episode of podagra, but has no physician diagnosis of gout.  Hyper lipidemia: Known hypertriglyceridemia, has been on multiple statins, as well as TriCor. More recently, he went off all of his prescription medicines, and the diet modification, as well as red rice yeast extract. This controlled his triglycerides well. He desires to continue natural methods of lipid control.  Foot pain: Present throughout the day, has never had custom orthotics.  Apnea: Greysen is having a large amount of snoring, daytime sleepiness.  Past medical history, Surgical history, Family history, Social history, Allergies, and medications have been entered into the medical record, reviewed, and no changes needed.   Review of Systems: No fevers, chills, night sweats, weight loss, chest pain, or shortness of breath.   Objective:    General: Well Developed, well nourished, and in no acute distress.  Neuro: Alert and oriented x3, extra-ocular muscles intact.  HEENT: Normocephalic, atraumatic, pupils equal round reactive to light, neck supple, no masses, no lymphadenopathy, thyroid nonpalpable.  Skin: Warm and dry, no rashes. Cardiac: Regular rate and rhythm, no murmurs rubs or gallops.  Respiratory: Clear to auscultation bilaterally. Not using accessory muscles, speaking in full sentences. Left Ankle: No visible erythema or swelling. Range of motion is full in all directions. Strength is 5/5 in all directions. Stable lateral and medial ligaments; squeeze test and kleiger test unremarkable; Talar dome nontender; No pain at base of 5th MT; No tenderness over cuboid; No tenderness over N spot or navicular prominence No  tenderness on posterior aspects of lateral and medial malleolus No sign of peroneal tendon subluxations or tenderness to palpation Negative tarsal tunnel tinel's Able to walk 4 steps. Cavus foot bilaterally.  Impression and Recommendations:

## 2011-12-16 NOTE — Assessment & Plan Note (Signed)
We'll order sleep study with CPAP titration.

## 2011-12-17 ENCOUNTER — Telehealth: Payer: Self-pay

## 2011-12-17 DIAGNOSIS — R0683 Snoring: Secondary | ICD-10-CM

## 2011-12-17 NOTE — Telephone Encounter (Signed)
See last office note 

## 2011-12-25 ENCOUNTER — Telehealth: Payer: Self-pay | Admitting: Sports Medicine

## 2011-12-25 NOTE — Telephone Encounter (Signed)
I need to know if the referral that is in the system for neurology is for ?sleep apnea.  You have snoring so I wanted to know if you want him tested for sleep apnea or to see a doctor?  thanks

## 2011-12-25 NOTE — Telephone Encounter (Signed)
Yeah that should not be a referral to neurology, it should be a referral for a sleep study.  He doesn't need to see another physician yet.

## 2011-12-26 ENCOUNTER — Other Ambulatory Visit: Payer: Self-pay | Admitting: Sports Medicine

## 2011-12-26 DIAGNOSIS — G4730 Sleep apnea, unspecified: Secondary | ICD-10-CM

## 2012-01-10 LAB — LIPID PANEL
Cholesterol: 221 mg/dL — ABNORMAL HIGH (ref 0–200)
HDL: 35 mg/dL — ABNORMAL LOW (ref >39)
Total CHOL/HDL Ratio: 6.3 Ratio
Triglycerides: 704 mg/dL — ABNORMAL HIGH (ref <150)

## 2012-01-10 LAB — URIC ACID: Uric Acid, Serum: 9.3 mg/dL — ABNORMAL HIGH (ref 4.0–7.8)

## 2012-01-13 ENCOUNTER — Encounter: Payer: Self-pay | Admitting: Sports Medicine

## 2012-01-13 MED ORDER — FENOFIBRATE 145 MG PO TABS: ORAL_TABLET | ORAL | Status: DC

## 2012-01-13 MED ORDER — ALLOPURINOL 300 MG PO TABS: 300.0000 mg | ORAL_TABLET | Freq: Every day | ORAL | Status: DC

## 2012-01-13 NOTE — Addendum Note (Signed)
Addended by: Monica Becton on: 01/13/2012 02:58 PM   Modules accepted: Orders

## 2013-03-16 ENCOUNTER — Other Ambulatory Visit: Payer: Self-pay | Admitting: Sports Medicine

## 2013-07-10 ENCOUNTER — Other Ambulatory Visit: Payer: Self-pay | Admitting: Sports Medicine

## 2013-08-06 ENCOUNTER — Encounter: Payer: Self-pay | Admitting: Emergency Medicine

## 2013-08-06 ENCOUNTER — Emergency Department
Admission: EM | Admit: 2013-08-06 | Discharge: 2013-08-06 | Disposition: A | Discharge status: Home / Self Care | Payer: BC Managed Care – PPO | Source: Home / Self Care | Attending: Family Medicine | Admitting: Family Medicine

## 2013-08-06 DIAGNOSIS — M10071 Idiopathic gout, right ankle and foot: Secondary | ICD-10-CM

## 2013-08-06 DIAGNOSIS — M109 Gout, unspecified: Secondary | ICD-10-CM

## 2013-08-06 HISTORY — DX: Gout, unspecified: M10.9

## 2013-08-06 MED ORDER — HYDROCODONE-ACETAMINOPHEN 5-325 MG PO TABS: ORAL_TABLET | ORAL | Status: DC

## 2013-08-06 MED ORDER — PREDNISONE 20 MG PO TABS: 20.0000 mg | ORAL_TABLET | Freq: Two times a day (BID) | ORAL | Status: DC

## 2013-08-06 NOTE — Discharge Instructions (Signed)
Increase fluid intake.  Recommend follow-up with Dr. Aundria Mems for management of uric acid and allopurinol dose.   Gout Gout is an inflammatory arthritis caused by a buildup of uric acid crystals in the joints. Uric acid is a chemical that is normally present in the blood. When the level of uric acid in the blood is too high it can form crystals that deposit in your joints and tissues. This causes joint redness, soreness, and swelling (inflammation). Repeat attacks are common. Over time, uric acid crystals can form into masses (tophi) near a joint, destroying bone and causing disfigurement. Gout is treatable and often preventable. CAUSES  The disease begins with elevated levels of uric acid in the blood. Uric acid is produced by your body when it breaks down a naturally found substance called purines. Certain foods you eat, such as meats and fish, contain high amounts of purines. Causes of an elevated uric acid level include:  Being passed down from parent to child (heredity).  Diseases that cause increased uric acid production (such as obesity, psoriasis, and certain cancers).  Excessive alcohol use.  Diet, especially diets rich in meat and seafood.  Medicines, including certain cancer-fighting medicines (chemotherapy), water pills (diuretics), and aspirin.  Chronic kidney disease. The kidneys are no longer able to remove uric acid well.  Problems with metabolism. Conditions strongly associated with gout include:  Obesity.  High blood pressure.  High cholesterol.  Diabetes. Not everyone with elevated uric acid levels gets gout. It is not understood why some people get gout and others do not. Surgery, joint injury, and eating too much of certain foods are some of the factors that can lead to gout attacks. SYMPTOMS   An attack of gout comes on quickly. It causes intense pain with redness, swelling, and warmth in a joint.  Fever can occur.  Often, only one joint is  involved. Certain joints are more commonly involved:  Base of the big toe.  Knee.  Ankle.  Wrist.  Finger. Without treatment, an attack usually goes away in a few days to weeks. Between attacks, you usually will not have symptoms, which is different from many other forms of arthritis. DIAGNOSIS  Your caregiver will suspect gout based on your symptoms and exam. In some cases, tests may be recommended. The tests may include:  Blood tests.  Urine tests.  X-rays.  Joint fluid exam. This exam requires a needle to remove fluid from the joint (arthrocentesis). Using a microscope, gout is confirmed when uric acid crystals are seen in the joint fluid. TREATMENT  There are two phases to gout treatment: treating the sudden onset (acute) attack and preventing attacks (prophylaxis).  Treatment of an Acute Attack.  Medicines are used. These include anti-inflammatory medicines or steroid medicines.  An injection of steroid medicine into the affected joint is sometimes necessary.  The painful joint is rested. Movement can worsen the arthritis.  You may use warm or cold treatments on painful joints, depending which works best for you.  Treatment to Prevent Attacks.  If you suffer from frequent gout attacks, your caregiver may advise preventive medicine. These medicines are started after the acute attack subsides. These medicines either help your kidneys eliminate uric acid from your body or decrease your uric acid production. You may need to stay on these medicines for a very long time.  The early phase of treatment with preventive medicine can be associated with an increase in acute gout attacks. For this reason, during the first few months  of treatment, your caregiver may also advise you to take medicines usually used for acute gout treatment. Be sure you understand your caregiver's directions. Your caregiver may make several adjustments to your medicine dose before these medicines are  effective.  Discuss dietary treatment with your caregiver or dietitian. Alcohol and drinks high in sugar and fructose and foods such as meat, poultry, and seafood can increase uric acid levels. Your caregiver or dietician can advise you on drinks and foods that should be limited. HOME CARE INSTRUCTIONS   Do not take aspirin to relieve pain. This raises uric acid levels.  Only take over-the-counter or prescription medicines for pain, discomfort, or fever as directed by your caregiver.  Rest the joint as much as possible. When in bed, keep sheets and blankets off painful areas.  Keep the affected joint raised (elevated).  Apply warm or cold treatments to painful joints. Use of warm or cold treatments depends on which works best for you.  Use crutches if the painful joint is in your leg.  Drink enough fluids to keep your urine clear or pale yellow. This helps your body get rid of uric acid. Limit alcohol, sugary drinks, and fructose drinks.  Follow your dietary instructions. Pay careful attention to the amount of protein you eat. Your daily diet should emphasize fruits, vegetables, whole grains, and fat-free or low-fat milk products. Discuss the use of coffee, vitamin C, and cherries with your caregiver or dietician. These may be helpful in lowering uric acid levels.  Maintain a healthy body weight. SEEK MEDICAL CARE IF:   You develop diarrhea, vomiting, or any side effects from medicines.  You do not feel better in 24 hours, or you are getting worse. SEEK IMMEDIATE MEDICAL CARE IF:   Your joint becomes suddenly more tender, and you have chills or a fever. MAKE SURE YOU:   Understand these instructions.  Will watch your condition.  Will get help right away if you are not doing well or get worse. Document Released: 03/22/2000 Document Revised: 07/20/2012 Document Reviewed: 11/06/2011 [facility] Patient Information 2014 [place].

## 2013-08-06 NOTE — ED Notes (Signed)
Gout in right ankle back of foot started yesterday 10/10

## 2013-08-06 NOTE — ED Provider Notes (Signed)
CSN: [id number]     Arrival date & time 08/06/13  0920 History   First MD Initiated Contact with Patient 08/06/13 [phone]     Chief Complaint  Patient presents with  . Gout      HPI Comments: Patient developed onset of pain and swelling in the back of his foot at about 4pm yesterday.  The pain kept him awake last night.  No response to ibuprofen 400mg .  He has not had a gout attack recently.  No recent change in activities or diet.  He reports that he started on a new bottle of allopurinol several days ago.  He feels well otherwise.  Patient is a 43 y.o. male presenting with lower extremity pain. The history is provided by the patient.  Foot Pain This is a new problem. The current episode started yesterday. The problem occurs constantly. The problem has not changed since onset.Associated symptoms comments: none. The symptoms are aggravated by walking. Nothing relieves the symptoms. Treatments tried: ibuprofen. The treatment provided mild relief.    Past Medical History  Diagnosis Date  . Hyperlipidemia   . Gout    Past Surgical History  Procedure Laterality Date  . Hernia repair    . Cervical fusion     Family History  Problem Relation Age of Onset  . Cancer Mother     colon  . Gout Father   . Stroke Maternal Grandmother   . COPD Maternal Grandfather   . Cancer Paternal Grandmother     lung  . Cancer Paternal Grandfather     colon and lung   History  Substance Use Topics  . Smoking status: Former Smoker -- 0.25 packs/day for 20 years    Types: Cigarettes  . Smokeless tobacco: Current User    Types: Snuff  . Alcohol Use: 6.0 oz/week    10 Cans of beer per week    Review of Systems  Constitutional: Positive for activity change. Negative for fever and fatigue.  Musculoskeletal: Negative.  Back pain: right heel pain.  All other systems reviewed and are negative.   Allergies  Penicillins  Home Medications   Prior to Admission medications   Medication Sig Start Date  End Date Taking? Authorizing Provider  allopurinol (ZYLOPRIM) 300 MG tablet TAKE 1 TABLET BY MOUTH EVERY DAY    Silverio Decamp, MD  fenofibrate (TRICOR) 145 MG tablet One half tab daily for a week, then one whole tab daily. 01/13/12   Silverio Decamp, MD  Plant Sterols and Stanols 450 MG TABS Oral daily 12/16/11   Silverio Decamp, MD  Red Yeast Rice Extract POWD Oral daily 12/16/11   Silverio Decamp, MD   BP 126/80  Pulse 71  Temp(Src) 98 F (36.7 C) (Oral)  Ht 5\' 9"  (1.753 m)  Wt 259 lb (117.482 kg)  BMI 38.23 kg/m2  SpO2 98% Physical Exam  Nursing note and vitals reviewed. Constitutional: He is oriented to person, place, and time. He appears well-developed and well-nourished. No distress.  Patient is obese (BMI 38.2)  HENT:  Head: Normocephalic.  Eyes: Conjunctivae are normal. Pupils are equal, round, and reactive to light.  Musculoskeletal:       Right foot: He exhibits decreased range of motion, tenderness and swelling. He exhibits no bony tenderness, normal capillary refill and no crepitus.       Feet:  Posterior heel reveals tenderness and mild warmth at insertion of achilles tendon.  Neurological: He is alert and oriented to person, place,  and time.  Skin: Skin is warm and dry.    ED Course  Procedures  none    Labs Reviewed  URIC ACID         MDM   1. Acute idiopathic gout of right foot (retrocalcaneal bursitis)    Check uric acid level.  Begin prednisone burst.  Lortab for pain at bedtime. Increase fluid intake.  If a gout flare-up recurs, suggest taking Ibuprofen 200mg , 4 tabs every 8 hours with food.  Recommend follow-up with Dr. Aundria Mems for management of uric acid and allopurinol dose.    Kandra Nicolas, MD 08/06/13 1017

## 2013-08-07 LAB — URIC ACID: URIC ACID, SERUM: 6.3 mg/dL (ref 4.0–7.8)

## 2013-08-10 ENCOUNTER — Telehealth: Payer: Self-pay | Admitting: *Deleted

## 2013-11-15 ENCOUNTER — Other Ambulatory Visit: Payer: Self-pay | Admitting: Sports Medicine

## 2014-02-03 ENCOUNTER — Emergency Department
Admission: EM | Admit: 2014-02-03 | Discharge: 2014-02-03 | Disposition: A | Discharge status: Home / Self Care | Payer: BC Managed Care – PPO | Source: Home / Self Care | Attending: Emergency Medicine | Admitting: Emergency Medicine

## 2014-02-03 ENCOUNTER — Encounter: Payer: Self-pay | Admitting: Emergency Medicine

## 2014-02-03 DIAGNOSIS — M10071 Idiopathic gout, right ankle and foot: Secondary | ICD-10-CM

## 2014-02-03 DIAGNOSIS — M109 Gout, unspecified: Secondary | ICD-10-CM

## 2014-02-03 MED ORDER — COLCHICINE 0.6 MG PO TABS: ORAL_TABLET | ORAL | Status: DC

## 2014-02-03 MED ORDER — PREDNISONE (PAK) 10 MG PO TABS: ORAL_TABLET | ORAL | Status: DC

## 2014-02-03 MED ORDER — HYDROCODONE-ACETAMINOPHEN 5-325 MG PO TABS: 1.0000 | ORAL_TABLET | ORAL | Status: DC | PRN

## 2014-02-03 NOTE — ED Notes (Signed)
Dakota Garcia c/o gout flare to right great toe x this AM. Hx of gout. He has been off of allopurinol for 6 months. Was unable to refill med b/c he had not f/u with Dr. Dianah Field.

## 2014-02-03 NOTE — ED Notes (Signed)
F/U apt made with PCP for 02/09/14 @9 :15am. Apt given to patient while in office.

## 2014-02-03 NOTE — ED Provider Notes (Signed)
CSN: [id number]     Arrival date & time 02/03/14  1025 History   First MD Initiated Contact with Patient 02/03/14 1044     Chief Complaint  Patient presents with  . Gout    HPI Dakota Garcia c/o gout flare to right great toe x this AM. Hx of gout. He has been off of allopurinol for 6 months. Was unable to refill med b/c he had not f/u with Dr. Dianah Field.  He admits noncompliance, not currently taking allopurinol or TriCor. He feels this flareup is severe, pain 7 out of 10 right great toe, throbbing. Tried ibuprofen without help. He admits to having 2 mild flareups of gout over the summer, resolved with ibuprofen.  He recalls no acute injury. No fever or chills or systemic symptoms Past Medical History  Diagnosis Date  . Hyperlipidemia   . Gout    Past Surgical History  Procedure Laterality Date  . Hernia repair    . Cervical fusion     Family History  Problem Relation Age of Onset  . Cancer Mother     colon  . Gout Father   . Stroke Maternal Grandmother   . COPD Maternal Grandfather   . Cancer Paternal Grandmother     lung  . Cancer Paternal Grandfather     colon and lung   History  Substance Use Topics  . Smoking status: Former Smoker -- 0.25 packs/day for 20 years    Types: Cigarettes  . Smokeless tobacco: Current User    Types: Snuff  . Alcohol Use: 6.0 oz/week    10 Cans of beer per week    Review of Systems  All other systems reviewed and are negative.   Allergies  Penicillins  Home Medications   Prior to Admission medications   Medication Sig Start Date End Date Taking? Authorizing Provider  Multiple Vitamin (MULTIVITAMIN) tablet Take 1 tablet by mouth daily.   Yes Historical Provider, MD  allopurinol (ZYLOPRIM) 300 MG tablet TAKE 1 TABLET BY MOUTH EVERY DAY    Silverio Decamp, MD  colchicine 0.6 MG tablet Take 1 tablet twice a day with food for gout attack 02/03/14   Jacqulyn Cane, MD  fenofibrate (TRICOR) 145 MG tablet One half tab daily for a  week, then one whole tab daily. 01/13/12   Silverio Decamp, MD  HYDROcodone-acetaminophen (NORCO/VICODIN) 5-325 MG per tablet Take 1-2 tablets by mouth every 4 (four) hours as needed for severe pain. Take with food. 02/03/14   Jacqulyn Cane, MD  Plant Sterols and Stanols 450 MG TABS Oral daily 12/16/11   Silverio Decamp, MD  predniSONE (STERAPRED UNI-PAK) 10 MG tablet Take as directed for 6 days. 02/03/14   Jacqulyn Cane, MD  Red Yeast Rice Extract POWD Oral daily 12/16/11   Silverio Decamp, MD   BP 112/78  Pulse 92  Temp(Src) 98 F (36.7 C) (Oral)  Resp 16  Wt 259 lb (117.482 kg)  SpO2 97% Physical Exam  Nursing note and vitals reviewed. Constitutional: He is oriented to person, place, and time. He appears well-developed and well-nourished. No distress.  Uncomfortable from right foot pain. He walks on his right heel.  HENT:  Head: Normocephalic and atraumatic.  Eyes: Conjunctivae and EOM are normal. Pupils are equal, round, and reactive to light. No scleral icterus.  Neck: Normal range of motion.  Cardiovascular: Normal rate.   Pulmonary/Chest: Effort normal.  Abdominal: He exhibits no distension.  Musculoskeletal: Normal range of motion.  Swollen, tender, warm  right first MTPJ and surrounding soft tissues. No red streaks. Right ankle within normal limits. No calf Tenderness or cords  Neurological: He is alert and oriented to person, place, and time.  Skin: Skin is warm.  Psychiatric: He has a normal mood and affect.    ED Course  Procedures (including critical care time) Labs Review Labs Reviewed - No data to display  Imaging Review No results found.   MDM   1. Acute gout of right foot, unspecified cause    he's had this in the past. Clinically, no evidence of osteomyelitis or infection. No history of acute injury. He declined any testing or x-rays today.--Noncompliance with allopurinol may be a factor.  Treatment options discussed, as well as risks,  benefits, alternatives. Patient voiced understanding and agreement with the following plans: New Prescriptions   COLCHICINE 0.6 MG TABLET    Take 1 tablet twice a day with food for gout attack   HYDROCODONE-ACETAMINOPHEN (NORCO/VICODIN) 5-325 MG PER TABLET    Take 1-2 tablets by mouth every 4 (four) hours as needed for severe pain. Take with food.   PREDNISONE (STERAPRED UNI-PAK) 10 MG TABLET    Take as directed for 6 days.   Other advice given. Drink plenty of fluids. Note written to excuse from work 10/29 through 11/1. Explained importance of compliance and the need to regularly follow up with Dr. Darene Lamer. We made an appointment for follow-up with Dr. Dianah Field 02/09/2014.--I explained risks of not keeping this appt and risks of not being compliant. Precautions discussed. Red flags discussed. Questions invited and answered. Patient voiced understanding and agreement.      Jacqulyn Cane, MD 02/03/14 [phone]

## 2014-02-09 ENCOUNTER — Ambulatory Visit: Payer: BC Managed Care – PPO | Admitting: Sports Medicine

## 2014-02-15 ENCOUNTER — Encounter: Payer: Self-pay | Admitting: Sports Medicine

## 2014-02-15 ENCOUNTER — Ambulatory Visit (INDEPENDENT_AMBULATORY_CARE_PROVIDER_SITE_OTHER): Payer: BC Managed Care – PPO | Admitting: Sports Medicine

## 2014-02-15 DIAGNOSIS — M79672 Pain in left foot: Secondary | ICD-10-CM | POA: Diagnosis not present

## 2014-02-15 DIAGNOSIS — Z23 Encounter for immunization: Secondary | ICD-10-CM | POA: Diagnosis not present

## 2014-02-15 DIAGNOSIS — E785 Hyperlipidemia, unspecified: Secondary | ICD-10-CM | POA: Diagnosis not present

## 2014-02-15 DIAGNOSIS — L570 Actinic keratosis: Secondary | ICD-10-CM | POA: Diagnosis not present

## 2014-02-15 MED ORDER — ALLOPURINOL 300 MG PO TABS: 300.0000 mg | ORAL_TABLET | Freq: Every day | ORAL | Status: DC

## 2014-02-15 MED ORDER — FENOFIBRATE 145 MG PO TABS: ORAL_TABLET | ORAL | Status: DC

## 2014-02-15 NOTE — Assessment & Plan Note (Signed)
Restart TriCor. Next line recheck lipids in 2 months.

## 2014-02-15 NOTE — Progress Notes (Signed)
  Subjective:    CC: follow-up  HPI: I haven't seen Dakota Garcia in over a year, he has gout, uncontrolled. Uric acid levels were initially 9, we started allopurinol may drop to 6, more recently he ran out of allopurinol and had 2 visits to the urgent care. He does desire to restart.  Hyperlipidemia: Moderate control on TriCor, needs a refill.  Skin lesion: Right arm, slowly growing, bleeds easily.  Past medical history, Surgical history, Family history not pertinant except as noted below, Social history, Allergies, and medications have been entered into the medical record, reviewed, and no changes needed.   Review of Systems: No fevers, chills, night sweats, weight loss, chest pain, or shortness of breath.   Objective:    General: Well Developed, well nourished, and in no acute distress.  Neuro: Alert and oriented x3, extra-ocular muscles intact, sensation grossly intact.  HEENT: Normocephalic, atraumatic, pupils equal round reactive to light, neck supple, no masses, no lymphadenopathy, thyroid nonpalpable.  Skin: Warm and dry, no rashes. There is a 1.1 cm friable skin lesion that appears to be an actinic keratosis on his right dorsal forearm. Cardiac: Regular rate and rhythm, no murmurs rubs or gallops, no lower extremity edema.  Respiratory: Clear to auscultation bilaterally. Not using accessory muscles, speaking in full sentences.  Procedure:  Excision of  1.1 synovator right-sided skin lesion Risks, benefits, and alternatives explained and consent obtained. Time out conducted. Surface prepped with alcohol. 5cc lidocaine with epinephine infiltrated in a field block. Adequate anesthesia ensured. Area prepped and draped in a sterile fashion. Excision performed with:6 mm punch biopsy used to excise the lesion in its entirety, a single 4-0 proline suture was placed in a horizontal mattress to close the incision. Hemostasis achieved. Pt stable.  Impression and Recommendations:

## 2014-02-15 NOTE — Assessment & Plan Note (Signed)
Surgical excision as above. Next line return in one week for suture removal.

## 2014-02-15 NOTE — Assessment & Plan Note (Signed)
Restarting allopurinol, return in 3 months. Recheck uric acid levels in 2 months.

## 2014-02-23 ENCOUNTER — Ambulatory Visit (INDEPENDENT_AMBULATORY_CARE_PROVIDER_SITE_OTHER): Payer: BC Managed Care – PPO | Admitting: Sports Medicine

## 2014-02-23 DIAGNOSIS — L570 Actinic keratosis: Secondary | ICD-10-CM

## 2014-02-23 NOTE — Progress Notes (Signed)
  Subjective: 1 week post skin biopsy, doing very well, here for suture removal.  Nurse attempted and was unable to remove suture.   Objective: General: Well-developed, well-nourished, and in no acute distress. Right arm: incision is clean, dry, intact, and well-healed.  I was easily able to remove the Prolene suture was placed in a horizontal mattress pattern.  Assessment/plan:

## 2014-02-23 NOTE — Assessment & Plan Note (Signed)
Suture removal as above. Pathology showed inflamed seborrheic keratosis. Return as needed.

## 2014-05-09 ENCOUNTER — Ambulatory Visit: Payer: BC Managed Care – PPO | Admitting: Sports Medicine

## 2014-05-10 LAB — HEMOGLOBIN A1C
Hgb A1c MFr Bld: 5.6 % (ref <5.7)
Mean Plasma Glucose: 114 mg/dL (ref <117)

## 2014-05-10 LAB — COMPREHENSIVE METABOLIC PANEL WITH GFR
AST: 18 U/L (ref 0–37)
Alkaline Phosphatase: 76 U/L (ref 39–117)
BUN: 19 mg/dL (ref 6–23)
Potassium: 4 meq/L (ref 3.5–5.3)
Sodium: 138 meq/L (ref 135–145)

## 2014-05-10 LAB — LIPID PANEL
Cholesterol: 233 mg/dL — ABNORMAL HIGH (ref 0–200)
HDL: 31 mg/dL — ABNORMAL LOW (ref >39)
Total CHOL/HDL Ratio: 7.5 Ratio
Triglycerides: 878 mg/dL — ABNORMAL HIGH (ref <150)

## 2014-05-10 LAB — URIC ACID: Uric Acid, Serum: 6.4 mg/dL (ref 4.0–7.8)

## 2014-05-10 LAB — COMPREHENSIVE METABOLIC PANEL
ALT: 20 U/L (ref 0–53)
Albumin: 4.2 g/dL (ref 3.5–5.2)
CO2: 28 mEq/L (ref 19–32)
Calcium: 9.4 mg/dL (ref 8.4–10.5)
Chloride: 100 mEq/L (ref 96–112)
Creat: 1.03 mg/dL (ref 0.50–1.35)
Glucose, Bld: 105 mg/dL — ABNORMAL HIGH (ref 70–99)
Total Bilirubin: 0.4 mg/dL (ref 0.2–1.2)
Total Protein: 7.2 g/dL (ref 6.0–8.3)

## 2014-05-16 ENCOUNTER — Ambulatory Visit (INDEPENDENT_AMBULATORY_CARE_PROVIDER_SITE_OTHER): Payer: BLUE CROSS/BLUE SHIELD | Admitting: Sports Medicine

## 2014-05-16 ENCOUNTER — Encounter: Payer: Self-pay | Admitting: Sports Medicine

## 2014-05-16 VITALS — BP 113/77 | HR 98 | Ht 68.0 in | Wt 272.0 lb

## 2014-05-16 DIAGNOSIS — E669 Obesity, unspecified: Secondary | ICD-10-CM | POA: Diagnosis not present

## 2014-05-16 DIAGNOSIS — M79672 Pain in left foot: Secondary | ICD-10-CM

## 2014-05-16 DIAGNOSIS — E785 Hyperlipidemia, unspecified: Secondary | ICD-10-CM | POA: Diagnosis not present

## 2014-05-16 MED ORDER — NIACIN ER (ANTIHYPERLIPIDEMIC) 1000 MG PO TBCR: 1000.0000 mg | EXTENDED_RELEASE_TABLET | Freq: Every day | ORAL | Status: DC

## 2014-05-16 MED ORDER — PHENTERMINE HCL 37.5 MG PO CAPS: ORAL_CAPSULE | ORAL | Status: DC

## 2014-05-16 MED ORDER — ALLOPURINOL 300 MG PO TABS: 300.0000 mg | ORAL_TABLET | Freq: Two times a day (BID) | ORAL | Status: DC

## 2014-05-16 NOTE — Assessment & Plan Note (Signed)
Continue TriCor, adding niacin.

## 2014-05-16 NOTE — Assessment & Plan Note (Signed)
Increasing allopurinol to twice a day, uric acid levels were greater than 5.

## 2014-05-16 NOTE — Assessment & Plan Note (Signed)
Starting phentermine, return monthly for weight checks and refills. 

## 2014-05-16 NOTE — Progress Notes (Signed)
  Subjective:    CC: follow-up  HPI: Hypertriglyceridemia: Persistent despite TriCor.  Gout: Doing well, no further flares, his uric acid levels dropped to 6. He is doing allopurinol once per day.  Obesity: Understands that weight loss will help all of the above. Amenable to try phentermine.  Past medical history, Surgical history, Family history not pertinant except as noted below, Social history, Allergies, and medications have been entered into the medical record, reviewed, and no changes needed.   Review of Systems: No fevers, chills, night sweats, weight loss, chest pain, or shortness of breath.   Objective:    General: Well Developed, well nourished, and in no acute distress.  Neuro: Alert and oriented x3, extra-ocular muscles intact, sensation grossly intact.  HEENT: Normocephalic, atraumatic, pupils equal round reactive to light, neck supple, no masses, no lymphadenopathy, thyroid nonpalpable.  Skin: Warm and dry, no rashes. Cardiac: Regular rate and rhythm, no murmurs rubs or gallops, no lower extremity edema.  Respiratory: Clear to auscultation bilaterally. Not using accessory muscles, speaking in full sentences.  Impression and Recommendations:

## 2014-06-24 ENCOUNTER — Ambulatory Visit (INDEPENDENT_AMBULATORY_CARE_PROVIDER_SITE_OTHER): Payer: BLUE CROSS/BLUE SHIELD | Admitting: Sports Medicine

## 2014-06-24 ENCOUNTER — Encounter: Payer: Self-pay | Admitting: Sports Medicine

## 2014-06-24 VITALS — BP 117/79 | HR 90 | Ht 69.0 in | Wt 256.0 lb

## 2014-06-24 DIAGNOSIS — E669 Obesity, unspecified: Secondary | ICD-10-CM

## 2014-06-24 DIAGNOSIS — E785 Hyperlipidemia, unspecified: Secondary | ICD-10-CM | POA: Diagnosis not present

## 2014-06-24 MED ORDER — ASPIRIN EC 325 MG PO TBEC: DELAYED_RELEASE_TABLET | ORAL | Status: DC

## 2014-06-24 MED ORDER — NIACIN ER (ANTIHYPERLIPIDEMIC) 500 MG PO TBCR: 500.0000 mg | EXTENDED_RELEASE_TABLET | Freq: Every day | ORAL | Status: DC

## 2014-06-24 MED ORDER — PHENTERMINE HCL 37.5 MG PO CAPS: ORAL_CAPSULE | ORAL | Status: DC

## 2014-06-24 NOTE — Assessment & Plan Note (Signed)
Persistent hypertriglyceridemia despite maximal TriCor. Flushing with max dose niacin, decreasing to 500 mg and adding an aspirin. In one month we will try to go back to 1000 mg.

## 2014-06-24 NOTE — Progress Notes (Signed)
  Subjective:    CC: Follow-up  HPI: Obesity: Excellent weight loss, amenable to continue medication.  Hyperlipidemia/hypertriglyceridemia: Inadequate control despite maximal fenofibrate, we added 1000 mg of Niaspan at the last visit which caused intolerable flushing, he is amenable to decrease the dose and use aspirin.  Past medical history, Surgical history, Family history not pertinant except as noted below, Social history, Allergies, and medications have been entered into the medical record, reviewed, and no changes needed.   Review of Systems: No fevers, chills, night sweats, weight loss, chest pain, or shortness of breath.   Objective:    General: Well Developed, well nourished, and in no acute distress.  Neuro: Alert and oriented x3, extra-ocular muscles intact, sensation grossly intact.  HEENT: Normocephalic, atraumatic, pupils equal round reactive to light, neck supple, no masses, no lymphadenopathy, thyroid nonpalpable.  Skin: Warm and dry, no rashes. Cardiac: Regular rate and rhythm, no murmurs rubs or gallops, no lower extremity edema.  Respiratory: Clear to auscultation bilaterally. Not using accessory muscles, speaking in full sentences.  Impression and Recommendations:

## 2014-06-24 NOTE — Assessment & Plan Note (Signed)
16 pound weight loss in the first month. Refilling phentermine, return in a month. We are entering the second month.

## 2014-07-20 ENCOUNTER — Encounter: Payer: Self-pay | Admitting: Sports Medicine

## 2014-07-20 ENCOUNTER — Ambulatory Visit (INDEPENDENT_AMBULATORY_CARE_PROVIDER_SITE_OTHER): Payer: BLUE CROSS/BLUE SHIELD | Admitting: Sports Medicine

## 2014-07-20 VITALS — BP 113/63 | HR 91 | Ht 69.0 in | Wt 254.0 lb

## 2014-07-20 DIAGNOSIS — E669 Obesity, unspecified: Secondary | ICD-10-CM | POA: Diagnosis not present

## 2014-07-20 DIAGNOSIS — E785 Hyperlipidemia, unspecified: Secondary | ICD-10-CM | POA: Diagnosis not present

## 2014-07-20 MED ORDER — PHENTERMINE HCL 37.5 MG PO TABS: ORAL_TABLET | ORAL | Status: DC

## 2014-07-20 MED ORDER — NIACIN ER (ANTIHYPERLIPIDEMIC) 1000 MG PO TBCR: 1000.0000 mg | EXTENDED_RELEASE_TABLET | Freq: Every day | ORAL | Status: DC

## 2014-07-20 NOTE — Assessment & Plan Note (Addendum)
Continued although minimal weight loss.  Refilling phentermine, he will do one half tab twice a day. We are entering the third month.

## 2014-07-20 NOTE — Assessment & Plan Note (Signed)
Assistant hypertriglyceridemia despite maximal doses of TriCor, we decreased to 500 mg of niacin with aspirin and this decreased any flushing. We are going to go back up to 1000 mg with aspirin. We can recheck triglycerides in 2-3 months.

## 2014-07-20 NOTE — Progress Notes (Signed)
  Subjective:    CC: Weight check  HPI: Hypertriglyceridemia:  Finally able to tolerate 500mg  niacin with aspirin.  Amenable to increase to 1000mg .  Obesity:  2lb weight loss.  No side effects.  Past medical history, Surgical history, Family history not pertinant except as noted below, Social history, Allergies, and medications have been entered into the medical record, reviewed, and no changes needed.   Review of Systems: No fevers, chills, night sweats, weight loss, chest pain, or shortness of breath.   Objective:    General: Well Developed, well nourished, and in no acute distress.  Neuro: Alert and oriented x3, extra-ocular muscles intact, sensation grossly intact.  HEENT: Normocephalic, atraumatic, pupils equal round reactive to light, neck supple, no masses, no lymphadenopathy, thyroid nonpalpable.  Skin: Warm and dry, no rashes. Cardiac: Regular rate and rhythm, no murmurs rubs or gallops, no lower extremity edema.  Respiratory: Clear to auscultation bilaterally. Not using accessory muscles, speaking in full sentences.  Impression and Recommendations:

## 2014-08-18 ENCOUNTER — Encounter: Payer: Self-pay | Admitting: Sports Medicine

## 2014-08-18 ENCOUNTER — Ambulatory Visit (INDEPENDENT_AMBULATORY_CARE_PROVIDER_SITE_OTHER): Payer: BLUE CROSS/BLUE SHIELD | Admitting: Sports Medicine

## 2014-08-18 DIAGNOSIS — E669 Obesity, unspecified: Secondary | ICD-10-CM

## 2014-08-18 MED ORDER — PHENTERMINE HCL 37.5 MG PO TABS: ORAL_TABLET | ORAL | Status: DC

## 2014-08-18 MED ORDER — LINACLOTIDE 290 MCG PO CAPS: 290.0000 ug | ORAL_CAPSULE | Freq: Every day | ORAL | Status: DC

## 2014-08-18 NOTE — Assessment & Plan Note (Signed)
Additional 5 pound weight loss as we enter the fourth month.  A little bit of constipation, adding Linzess.

## 2014-08-18 NOTE — Progress Notes (Signed)
  Subjective:    CC: Follow-up  HPI: Arya continues to lose weight, 5 pounds since last visit, he has switched to one half tab twice a day. He is amenable to continue medication.  Past medical history, Surgical history, Family history not pertinant except as noted below, Social history, Allergies, and medications have been entered into the medical record, reviewed, and no changes needed.   Review of Systems: No fevers, chills, night sweats, weight loss, chest pain, or shortness of breath.   Objective:    General: Well Developed, well nourished, and in no acute distress.  Neuro: Alert and oriented x3, extra-ocular muscles intact, sensation grossly intact.  HEENT: Normocephalic, atraumatic, pupils equal round reactive to light, neck supple, no masses, no lymphadenopathy, thyroid nonpalpable.  Skin: Warm and dry, no rashes. Cardiac: Regular rate and rhythm, no murmurs rubs or gallops, no lower extremity edema.  Respiratory: Clear to auscultation bilaterally. Not using accessory muscles, speaking in full sentences.  Impression and Recommendations:

## 2014-08-25 ENCOUNTER — Other Ambulatory Visit: Payer: Self-pay | Admitting: *Deleted

## 2014-08-25 DIAGNOSIS — M79672 Pain in left foot: Secondary | ICD-10-CM

## 2014-08-25 DIAGNOSIS — E785 Hyperlipidemia, unspecified: Secondary | ICD-10-CM

## 2014-08-25 MED ORDER — ALLOPURINOL 300 MG PO TABS: 300.0000 mg | ORAL_TABLET | Freq: Two times a day (BID) | ORAL | Status: DC

## 2014-08-25 MED ORDER — NIACIN ER (ANTIHYPERLIPIDEMIC) 1000 MG PO TBCR: 1000.0000 mg | EXTENDED_RELEASE_TABLET | Freq: Every day | ORAL | Status: DC

## 2014-09-19 ENCOUNTER — Ambulatory Visit: Payer: BLUE CROSS/BLUE SHIELD | Admitting: Sports Medicine

## 2015-04-30 ENCOUNTER — Other Ambulatory Visit: Payer: Self-pay | Admitting: Sports Medicine

## 2015-06-26 ENCOUNTER — Other Ambulatory Visit: Payer: Self-pay | Admitting: Sports Medicine

## 2015-10-09 ENCOUNTER — Other Ambulatory Visit: Payer: Self-pay | Admitting: Sports Medicine

## 2015-10-26 ENCOUNTER — Encounter: Payer: Self-pay | Admitting: Sports Medicine

## 2015-10-26 ENCOUNTER — Ambulatory Visit (INDEPENDENT_AMBULATORY_CARE_PROVIDER_SITE_OTHER): Payer: BLUE CROSS/BLUE SHIELD | Admitting: Sports Medicine

## 2015-10-26 VITALS — BP 102/71 | HR 86 | Resp 18 | Wt 265.1 lb

## 2015-10-26 DIAGNOSIS — F411 Generalized anxiety disorder: Secondary | ICD-10-CM

## 2015-10-26 DIAGNOSIS — R519 Headache, unspecified: Secondary | ICD-10-CM

## 2015-10-26 DIAGNOSIS — R51 Headache: Secondary | ICD-10-CM

## 2015-10-26 DIAGNOSIS — G43909 Migraine, unspecified, not intractable, without status migrainosus: Secondary | ICD-10-CM | POA: Insufficient documentation

## 2015-10-26 DIAGNOSIS — E785 Hyperlipidemia, unspecified: Secondary | ICD-10-CM | POA: Diagnosis not present

## 2015-10-26 MED ORDER — KETOROLAC TROMETHAMINE 30 MG/ML IJ SOLN
30.0000 mg | Freq: Once | INTRAMUSCULAR | Status: AC
  Administered 2015-10-26: 30 mg via INTRAMUSCULAR

## 2015-10-26 MED ORDER — ALLOPURINOL 300 MG PO TABS
ORAL_TABLET | ORAL | Status: DC
Start: 2015-10-26 — End: 2017-02-12

## 2015-10-26 MED ORDER — DEXAMETHASONE SODIUM PHOSPHATE 4 MG/ML IJ SOLN
4.0000 mg | Freq: Once | INTRAMUSCULAR | Status: AC
  Administered 2015-10-26: 4 mg via INTRAMUSCULAR

## 2015-10-26 MED ORDER — FENOFIBRATE 160 MG PO TABS: 160.0000 mg | ORAL_TABLET | Freq: Every day | ORAL | Status: DC

## 2015-10-26 MED ORDER — ALLOPURINOL 300 MG PO TABS: ORAL_TABLET | ORAL | Status: DC

## 2015-10-26 MED ORDER — RIZATRIPTAN BENZOATE 10 MG PO TBDP: 10.0000 mg | ORAL_TABLET | ORAL | Status: DC | PRN

## 2015-10-26 MED ORDER — SERTRALINE HCL 50 MG PO TABS: 50.0000 mg | ORAL_TABLET | Freq: Every day | ORAL | Status: DC

## 2015-10-26 NOTE — Progress Notes (Signed)
  Subjective:    CC: Headaches  HPI: This is a pleasant 45 year old male, for the past several months he sat a new onset of severe headaches, right-sided and temporal, dull and throbbing, with photophobia, phonophobia, occasional nausea. No head trauma, symptoms are however worse at night and they do wake him up at night.  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 Now has severe nervousness, worrying about different things, irritability, difficulty controlling his worry, trouble relaxing, fear of impending doom and mild restlessness. Agreeable to try an oral medication.  Hyperlipidemia: Has not been taking his cholesterol medication, we have not seen him in over a year.  Past medical history, Surgical history, Family history not pertinant except as noted below, Social history, Allergies, and medications have been entered into the medical record, reviewed, and no changes needed.   Review of Systems: No fevers, chills, night sweats, weight loss, chest pain, or shortness of breath.   Objective:    General: Well Developed, well nourished, and in no acute distress.  Neuro: Alert and oriented x3, extra-ocular muscles intact, sensation grossly intact. Cranial nerves II through XII are intact, motor, sensory, coordinative functions are all intact. HEENT: Normocephalic, atraumatic, pupils equal round reactive to light, neck supple, no masses, no lymphadenopathy, thyroid nonpalpable.  Skin: Warm and dry, no rashes. Cardiac: Regular rate and rhythm, no murmurs rubs or gallops, no lower extremity edema.  Respiratory: Clear to auscultation bilaterally. Not using accessory muscles, speaking in full sentences.  Toradol 30 mg and dexamethasone 4 mg given intramuscular  Impression and Recommendations:    I spent 25 minutes with this patient, greater than 50% was face-to-face time counseling regarding the above diagnoses

## 2015-10-26 NOTE — Assessment & Plan Note (Addendum)
Unclear etiology, but does have severe symptoms in the morning with nausea. I do suspect migraine. At this point we do need to consider brain imaging for intracranial defect, MRI with and without IV contrast, checking renal function today. Adding Maxalt for abortive treatment.

## 2015-10-26 NOTE — Assessment & Plan Note (Signed)
Starting Zoloft, significant stress with his work. Return to see me in one month for repeat GAD7.

## 2015-10-26 NOTE — Assessment & Plan Note (Signed)
Off of all medications, refilling them and we will recheck lipids in 3 months.

## 2015-10-27 LAB — BASIC METABOLIC PANEL
CO2: 27 mmol/L (ref 20–31)
Chloride: 102 mmol/L (ref 98–110)
Creat: 0.92 mg/dL (ref 0.60–1.35)
Potassium: 4.1 mmol/L (ref 3.5–5.3)

## 2015-10-27 LAB — BASIC METABOLIC PANEL WITH GFR
BUN: 12 mg/dL (ref 7–25)
Calcium: 8.8 mg/dL (ref 8.6–10.3)
Glucose, Bld: 99 mg/dL (ref 65–99)
Sodium: 139 mmol/L (ref 135–146)

## 2015-11-03 ENCOUNTER — Other Ambulatory Visit: Payer: Self-pay | Admitting: Sports Medicine

## 2015-11-03 DIAGNOSIS — Z1389 Encounter for screening for other disorder: Secondary | ICD-10-CM

## 2015-11-03 DIAGNOSIS — Z0189 Encounter for other specified special examinations: Secondary | ICD-10-CM

## 2015-11-06 ENCOUNTER — Ambulatory Visit (INDEPENDENT_AMBULATORY_CARE_PROVIDER_SITE_OTHER): Payer: BLUE CROSS/BLUE SHIELD

## 2015-11-06 DIAGNOSIS — Z01818 Encounter for other preprocedural examination: Secondary | ICD-10-CM | POA: Diagnosis not present

## 2015-11-06 DIAGNOSIS — R110 Nausea: Secondary | ICD-10-CM

## 2015-11-06 DIAGNOSIS — R51 Headache: Secondary | ICD-10-CM

## 2015-11-06 DIAGNOSIS — Z1389 Encounter for screening for other disorder: Secondary | ICD-10-CM

## 2015-11-06 MED ORDER — GADOBENATE DIMEGLUMINE 529 MG/ML IV SOLN
20.0000 mL | Freq: Once | INTRAVENOUS | Status: AC | PRN
  Administered 2015-11-06: 20 mL via INTRAVENOUS

## 2015-11-09 ENCOUNTER — Encounter: Payer: Self-pay | Admitting: Sports Medicine

## 2015-11-09 ENCOUNTER — Ambulatory Visit (INDEPENDENT_AMBULATORY_CARE_PROVIDER_SITE_OTHER): Payer: BLUE CROSS/BLUE SHIELD | Admitting: Sports Medicine

## 2015-11-09 DIAGNOSIS — J341 Cyst and mucocele of nose and nasal sinus: Secondary | ICD-10-CM | POA: Diagnosis not present

## 2015-11-09 DIAGNOSIS — G43909 Migraine, unspecified, not intractable, without status migrainosus: Secondary | ICD-10-CM

## 2015-11-09 MED ORDER — TOPIRAMATE 50 MG PO TABS: ORAL_TABLET | ORAL | 3 refills | Status: DC

## 2015-11-09 MED ORDER — FLUTICASONE PROPIONATE 50 MCG/ACT NA SUSP: NASAL | 3 refills | Status: DC

## 2015-11-09 NOTE — Progress Notes (Signed)
  Subjective:    CC: Follow-up  HPI: Migraine headaches: Brain MRI was negative, good response to low-dose Maxalt, still has approximately 1-2 migraine headache days per week and desires to start preventative measures.  Past medical history, Surgical history, Family history not pertinant except as noted below, Social history, Allergies, and medications have been entered into the medical record, reviewed, and no changes needed.   Review of Systems: No fevers, chills, night sweats, weight loss, chest pain, or shortness of breath.   Objective:    General: Well Developed, well nourished, and in no acute distress.  Neuro: Alert and oriented x3, extra-ocular muscles intact, sensation grossly intact.  HEENT: Normocephalic, atraumatic, pupils equal round reactive to light, neck supple, no masses, no lymphadenopathy, thyroid nonpalpable.  Skin: Warm and dry, no rashes. Cardiac: Regular rate and rhythm, no murmurs rubs or gallops, no lower extremity edema.  Respiratory: Clear to auscultation bilaterally. Not using accessory muscles, speaking in full sentences.  Impression and Recommendations:    Migraine headache Brain MRI was negative with the exception of a right-sided mucous retention cyst in the maxillary sinus. Continue with Maxalt, we are going to add Topamax, headaches or at least once per week.   Mucous retention cyst of right maxillary sinus Minimal symptoms, adding intranasal fluticasone  I spent 25 minutes with this patient, greater than 50% was face-to-face time counseling regarding the above diagnoses

## 2015-11-09 NOTE — Assessment & Plan Note (Signed)
Minimal symptoms, adding intranasal fluticasone

## 2015-11-09 NOTE — Assessment & Plan Note (Signed)
Brain MRI was negative with the exception of a right-sided mucous retention cyst in the maxillary sinus. Continue with Maxalt, we are going to add Topamax, headaches or at least once per week.

## 2015-11-24 ENCOUNTER — Encounter: Payer: Self-pay | Admitting: Sports Medicine

## 2015-11-24 ENCOUNTER — Ambulatory Visit (INDEPENDENT_AMBULATORY_CARE_PROVIDER_SITE_OTHER): Payer: BLUE CROSS/BLUE SHIELD | Admitting: Sports Medicine

## 2015-11-24 DIAGNOSIS — G43909 Migraine, unspecified, not intractable, without status migrainosus: Secondary | ICD-10-CM | POA: Diagnosis not present

## 2015-11-24 DIAGNOSIS — F411 Generalized anxiety disorder: Secondary | ICD-10-CM | POA: Diagnosis not present

## 2015-11-24 MED ORDER — SERTRALINE HCL 100 MG PO TABS: 100.0000 mg | ORAL_TABLET | Freq: Every day | ORAL | 3 refills | Status: DC

## 2015-11-24 NOTE — Assessment & Plan Note (Signed)
Continue as needed Maxalt, discontinue Topamax which was intolerable even after a couple of weeks.

## 2015-11-24 NOTE — Assessment & Plan Note (Signed)
Increasing Zoloft 100 mg, return in one month for a GAD7

## 2015-11-24 NOTE — Progress Notes (Signed)
  Subjective:    CC: Follow-up  HPI: Migraine headaches: Okay currently, was unable to tolerate Topamax even after 2 weeks of use.  Generalized anxiety: A bit better, still has moderate difficulty controlling his worry, worrying about different things, trouble relaxing, and mild nervousness, restlessness, irritability, and fear of impending doom.  Past medical history, Surgical history, Family history not pertinant except as noted below, Social history, Allergies, and medications have been entered into the medical record, reviewed, and no changes needed.   Review of Systems: No fevers, chills, night sweats, weight loss, chest pain, or shortness of breath.   Objective:    General: Well Developed, well nourished, and in no acute distress.  Neuro: Alert and oriented x3, extra-ocular muscles intact, sensation grossly intact.  HEENT: Normocephalic, atraumatic, pupils equal round reactive to light, neck supple, no masses, no lymphadenopathy, thyroid nonpalpable.  Skin: Warm and dry, no rashes. Cardiac: Regular rate and rhythm, no murmurs rubs or gallops, no lower extremity edema.  Respiratory: Clear to auscultation bilaterally. Not using accessory muscles, speaking in full sentences.  Impression and Recommendations:    Generalized anxiety disorder Increasing Zoloft 100 mg, return in one month for a GAD7  Migraine headache Continue as needed Maxalt, discontinue Topamax which was intolerable even after a couple of weeks.  I spent 25 minutes with this patient, greater than 50% was face-to-face time counseling regarding the above diagnoses

## 2015-12-13 ENCOUNTER — Other Ambulatory Visit: Payer: Self-pay

## 2015-12-13 DIAGNOSIS — F411 Generalized anxiety disorder: Secondary | ICD-10-CM

## 2015-12-13 MED ORDER — SERTRALINE HCL 100 MG PO TABS: 100.0000 mg | ORAL_TABLET | Freq: Every day | ORAL | 1 refills | Status: DC

## 2015-12-25 ENCOUNTER — Ambulatory Visit: Payer: BLUE CROSS/BLUE SHIELD | Admitting: Sports Medicine

## 2016-06-28 ENCOUNTER — Ambulatory Visit (INDEPENDENT_AMBULATORY_CARE_PROVIDER_SITE_OTHER): Payer: BLUE CROSS/BLUE SHIELD | Admitting: Sports Medicine

## 2016-06-28 ENCOUNTER — Ambulatory Visit (INDEPENDENT_AMBULATORY_CARE_PROVIDER_SITE_OTHER): Payer: BLUE CROSS/BLUE SHIELD

## 2016-06-28 ENCOUNTER — Encounter: Payer: Self-pay | Admitting: Sports Medicine

## 2016-06-28 DIAGNOSIS — J45909 Unspecified asthma, uncomplicated: Secondary | ICD-10-CM

## 2016-06-28 DIAGNOSIS — J341 Cyst and mucocele of nose and nasal sinus: Secondary | ICD-10-CM

## 2016-06-28 MED ORDER — AZITHROMYCIN 250 MG PO TABS
ORAL_TABLET | ORAL | 0 refills | Status: DC
Start: 2016-06-28 — End: 2017-02-12

## 2016-06-28 MED ORDER — HYDROCOD POLST-CPM POLST ER 10-8 MG/5ML PO SUER: 5.0000 mL | Freq: Two times a day (BID) | ORAL | 0 refills | Status: DC | PRN

## 2016-06-28 MED ORDER — PREDNISONE 50 MG PO TABS: ORAL_TABLET | ORAL | 0 refills | Status: DC

## 2016-06-28 NOTE — Assessment & Plan Note (Signed)
Recurrent acute maxillary sinusitis. Now with secondary acute bronchitis. Azithromycin, prednisone, Tussionex, chest x-ray. Return if no better in 2 weeks.

## 2016-06-28 NOTE — Progress Notes (Signed)
  Subjective:    CC: Feeling sick  HPI: For several weeks now this pleasant 46 year old male has had viral upper rest of her symptoms, and myalgias consistent with influenza. Unfortunately he is continued to have a cough, sinus pain and pressure, and fatigue. Symptoms are moderate, persistent. No more fevers or chills, no shortness of breath, no chest pain.  Past medical history:  Negative.  See flowsheet/record as well for more information.  Surgical history: Negative.  See flowsheet/record as well for more information.  Family history: Negative.  See flowsheet/record as well for more information.  Social history: Negative.  See flowsheet/record as well for more information.  Allergies, and medications have been entered into the medical record, reviewed, and no changes needed.   Review of Systems: No fevers, chills, night sweats, weight loss, chest pain, or shortness of breath.   Objective:    General: Well Developed, well nourished, and in no acute distress.  Neuro: Alert and oriented x3, extra-ocular muscles intact, sensation grossly intact.  HEENT: Normocephalic, atraumatic, pupils equal round reactive to light, neck supple, no masses, no lymphadenopathy, thyroid nonpalpable. Oropharynx, nasopharynx, ear canals unremarkable, minimal tenderness over the maxillary sinuses. Skin: Warm and dry, no rashes. Cardiac: Regular rate and rhythm, no murmurs rubs or gallops, no lower extremity edema.  Respiratory: Clear to auscultation bilaterally. Not using accessory muscles, speaking in full sentences.  Impression and Recommendations:    Mucous retention cyst of right maxillary sinus Recurrent acute maxillary sinusitis. Now with secondary acute bronchitis. Azithromycin, prednisone, Tussionex, chest x-ray. Return if no better in 2 weeks.

## 2016-11-15 ENCOUNTER — Other Ambulatory Visit: Payer: Self-pay | Admitting: Sports Medicine

## 2016-11-15 DIAGNOSIS — E785 Hyperlipidemia, unspecified: Secondary | ICD-10-CM

## 2017-01-07 ENCOUNTER — Other Ambulatory Visit: Payer: Self-pay | Admitting: Sports Medicine

## 2017-01-07 DIAGNOSIS — F411 Generalized anxiety disorder: Secondary | ICD-10-CM

## 2017-02-12 ENCOUNTER — Ambulatory Visit (INDEPENDENT_AMBULATORY_CARE_PROVIDER_SITE_OTHER): Payer: BLUE CROSS/BLUE SHIELD | Admitting: Sports Medicine

## 2017-02-12 ENCOUNTER — Encounter: Payer: Self-pay | Admitting: Sports Medicine

## 2017-02-12 DIAGNOSIS — F411 Generalized anxiety disorder: Secondary | ICD-10-CM

## 2017-02-12 DIAGNOSIS — M79672 Pain in left foot: Secondary | ICD-10-CM | POA: Diagnosis not present

## 2017-02-12 DIAGNOSIS — G4730 Sleep apnea, unspecified: Secondary | ICD-10-CM

## 2017-02-12 DIAGNOSIS — Z0000 Encounter for general adult medical examination without abnormal findings: Secondary | ICD-10-CM | POA: Diagnosis not present

## 2017-02-12 DIAGNOSIS — E783 Hyperchylomicronemia: Secondary | ICD-10-CM | POA: Diagnosis not present

## 2017-02-12 MED ORDER — SERTRALINE HCL 100 MG PO TABS: 100.0000 mg | ORAL_TABLET | Freq: Every day | ORAL | 1 refills | Status: DC

## 2017-02-12 MED ORDER — FENOFIBRATE 160 MG PO TABS: 160.0000 mg | ORAL_TABLET | Freq: Every day | ORAL | 1 refills | Status: DC

## 2017-02-12 MED ORDER — ALLOPURINOL 300 MG PO TABS: ORAL_TABLET | ORAL | 1 refills | Status: DC

## 2017-02-12 NOTE — Assessment & Plan Note (Addendum)
Rechecking uric acid levels, refilling allopurinol. Uncontrolled hyperuricemia could certainly cause his widespread aches and pains. He has been out of allopurinol for a while.  Uric acid levels are still high when the allopurinol is restarted uric acid levels will probably come down and joints will likely feel better.

## 2017-02-12 NOTE — Assessment & Plan Note (Addendum)
Hypertriglyceridemia, has been on TriCor. If still elevated we will probably add Vascepa. Triglycerides have been almost 900.  Triglycerides are elevated at 300 down from 800.   We do need to add Lovaza and then recheck triglycerides in 2-3 months,

## 2017-02-12 NOTE — Assessment & Plan Note (Signed)
Worsening of symptoms, has been out of sertraline. Refilling medication, return in 1 month to recheck, he was having increasing fatigue but I think is due to his uncontrolled depression.

## 2017-02-12 NOTE — Assessment & Plan Note (Signed)
Compliant with CPAP, if no improvement in fatigue with refilling his sertraline we will do a repeat sleep study to evaluate for desaturations on CPAP.

## 2017-02-12 NOTE — Assessment & Plan Note (Signed)
We will count this as his preventive exam. Adding TSH levels, he declines influenza vaccine.

## 2017-02-12 NOTE — Progress Notes (Addendum)
  Subjective:    CC: Annual physical exam  HPI:  Hyperlipidemia: Stable on TriCor, has not had labs rechecked  Depression and anxiety: Needs a refill on medication, noting increasing fatigue, myalgias.  No suicidal or homicidal ideation  Gout: Needs a refill on allopurinol  Sleep apnea: Stable and compliant with CPAP  Past medical history:  Negative.  See flowsheet/record as well for more information.  Surgical history: Negative.  See flowsheet/record as well for more information.  Family history: Negative.  See flowsheet/record as well for more information.  Social history: Negative.  See flowsheet/record as well for more information.  Allergies, and medications have been entered into the medical record, reviewed, and no changes needed.    Review of Systems: No headache, visual changes, nausea, vomiting, diarrhea, constipation, dizziness, abdominal pain, skin rash, fevers, chills, night sweats, swollen lymph nodes, weight loss, chest pain, body aches, joint swelling, muscle aches, shortness of breath, mood changes, visual or auditory hallucinations.  Objective:    General: Well Developed, well nourished, and in no acute distress.  Neuro: Alert and oriented x3, extra-ocular muscles intact, sensation grossly intact. Cranial nerves II through XII are intact, motor, sensory, and coordinative functions are all intact. HEENT: Normocephalic, atraumatic, pupils equal round reactive to light, neck supple, no masses, no lymphadenopathy, thyroid nonpalpable. Oropharynx, nasopharynx, external ear canals are unremarkable. Skin: Warm and dry, no rashes noted.  Cardiac: Regular rate and rhythm, no murmurs rubs or gallops.  Respiratory: Clear to auscultation bilaterally. Not using accessory muscles, speaking in full sentences.  Abdominal: Soft, nontender, nondistended, positive bowel sounds, no masses, no organomegaly.  Musculoskeletal: Shoulder, elbow, wrist, hip, knee, ankle stable, and with full  range of motion.  Impression and Recommendations:    The patient was counselled, risk factors were discussed, anticipatory guidance given.  Generalized anxiety disorder Worsening of symptoms, has been out of sertraline. Refilling medication, return in 1 month to recheck, he was having increasing fatigue but I think is due to his uncontrolled depression.  Gout, left intertarsal joints. Rechecking uric acid levels, refilling allopurinol. Uncontrolled hyperuricemia could certainly cause his widespread aches and pains. He has been out of allopurinol for a while.  Uric acid levels are still high when the allopurinol is restarted uric acid levels will probably come down and joints will likely feel better.  Hyperlipidemia Hypertriglyceridemia, has been on TriCor. If still elevated we will probably add Vascepa. Triglycerides have been almost 900.  Triglycerides are elevated at 300 down from 800.   We do need to add Lovaza and then recheck triglycerides in 2-3 months,  Sleep apnea Compliant with CPAP, if no improvement in fatigue with refilling his sertraline we will do a repeat sleep study to evaluate for desaturations on CPAP.  Annual physical exam We will count this as his preventive exam. Adding TSH levels, he declines influenza vaccine. ___________________________________________ Gwen Her. Dianah Field, M.D., ABFM., CAQSM. Primary Care and [place] Instructor of [place] [facility]

## 2017-02-14 LAB — LIPID PANEL W/REFLEX DIRECT LDL
Cholesterol: 186 mg/dL (ref <200)
HDL: 34 mg/dL — ABNORMAL LOW (ref >40)
LDL Cholesterol (Calc): 100 mg/dL (calc) — ABNORMAL HIGH
Non-HDL Cholesterol (Calc): 152 mg/dL (calc) — ABNORMAL HIGH (ref <130)
Total CHOL/HDL Ratio: 5.5 (calc) — ABNORMAL HIGH (ref <5.0)
Triglycerides: 387 mg/dL — ABNORMAL HIGH (ref <150)

## 2017-02-14 LAB — CBC
HCT: 36.1 % — ABNORMAL LOW (ref 38.5–50.0)
Hemoglobin: 12.6 g/dL — ABNORMAL LOW (ref 13.2–17.1)
MCH: 31.9 pg (ref 27.0–33.0)
MCHC: 34.9 g/dL (ref 32.0–36.0)
MCV: 91.4 fL (ref 80.0–100.0)
MPV: 9.7 fL (ref 7.5–12.5)
Platelets: 233 Thousand/uL (ref 140–400)
RBC: 3.95 Million/uL — ABNORMAL LOW (ref 4.20–5.80)
RDW: 12.2 % (ref 11.0–15.0)
WBC: 5.1 10*3/uL (ref 3.8–10.8)

## 2017-02-14 LAB — HEMOGLOBIN A1C
Hgb A1c MFr Bld: 5.1 % of total Hgb (ref <5.7)
Mean Plasma Glucose: 100 (calc)
eAG (mmol/L): 5.5 (calc)

## 2017-02-14 LAB — COMPREHENSIVE METABOLIC PANEL WITH GFR
Albumin: 4.2 g/dL (ref 3.6–5.1)
Chloride: 105 mmol/L (ref 98–110)
Creat: 0.9 mg/dL (ref 0.60–1.35)
Globulin: 2.8 g/dL (ref 1.9–3.7)
Glucose, Bld: 99 mg/dL (ref 65–99)
Potassium: 4.6 mmol/L (ref 3.5–5.3)
Sodium: 140 mmol/L (ref 135–146)

## 2017-02-14 LAB — COMPREHENSIVE METABOLIC PANEL
AG Ratio: 1.5 (calc) (ref 1.0–2.5)
ALT: 21 U/L (ref 9–46)
AST: 22 U/L (ref 10–40)
Alkaline phosphatase (APISO): 75 U/L (ref 40–115)
BUN: 13 mg/dL (ref 7–25)
CO2: 26 mmol/L (ref 20–32)
Calcium: 9 mg/dL (ref 8.6–10.3)
Total Bilirubin: 0.3 mg/dL (ref 0.2–1.2)
Total Protein: 7 g/dL (ref 6.1–8.1)

## 2017-02-14 LAB — URIC ACID: Uric Acid, Serum: 6.7 mg/dL (ref 4.0–8.0)

## 2017-02-14 LAB — HIV ANTIBODY (ROUTINE TESTING W REFLEX): HIV 1&2 Ab, 4th Generation: NONREACTIVE

## 2017-02-14 MED ORDER — OMEGA-3-ACID ETHYL ESTERS 1 G PO CAPS: 2.0000 g | ORAL_CAPSULE | Freq: Two times a day (BID) | ORAL | 3 refills | Status: DC

## 2017-02-14 NOTE — Addendum Note (Signed)
Addended by: Silverio Decamp on: 02/14/2017 08:41 AM   Modules accepted: Orders

## 2017-02-20 ENCOUNTER — Telehealth: Payer: Self-pay | Admitting: *Deleted

## 2017-02-20 NOTE — Telephone Encounter (Signed)
Pre Authorization sent to cover my meds.[id number];Review Type:Prior Auth;Coverage Start Date:01/21/2017;Coverage End Date:02/20/2020    Patient and pharm notified via vm

## 2017-02-24 ENCOUNTER — Ambulatory Visit: Payer: BLUE CROSS/BLUE SHIELD | Admitting: Sports Medicine

## 2017-03-13 ENCOUNTER — Ambulatory Visit: Payer: BLUE CROSS/BLUE SHIELD | Admitting: Sports Medicine

## 2017-03-13 ENCOUNTER — Encounter: Payer: Self-pay | Admitting: Sports Medicine

## 2017-03-13 DIAGNOSIS — E783 Hyperchylomicronemia: Secondary | ICD-10-CM | POA: Diagnosis not present

## 2017-03-13 DIAGNOSIS — F411 Generalized anxiety disorder: Secondary | ICD-10-CM | POA: Diagnosis not present

## 2017-03-13 DIAGNOSIS — M79672 Pain in left foot: Secondary | ICD-10-CM

## 2017-03-13 MED ORDER — SERTRALINE HCL 100 MG PO TABS: 200.0000 mg | ORAL_TABLET | Freq: Every day | ORAL | 11 refills | Status: DC

## 2017-03-13 NOTE — Assessment & Plan Note (Signed)
I do suspect that his widespread arthralgias were due to subclinical gout flare. This is improved considerably with restarting allopurinol, we can recheck his levels in a couple of months when we recheck his lipids.

## 2017-03-13 NOTE — Assessment & Plan Note (Signed)
Hypertriglyceridemia on TriCor, they were down to 300 from 800, we added Lovaza which he has not yet taken. He will start this and we can recheck fasting lipids in 2 months.

## 2017-03-13 NOTE — Assessment & Plan Note (Signed)
Modest improvements in mood. Increasing Zoloft 200 mg, return in 1 month for a PHQ and GAD.

## 2017-03-13 NOTE — Progress Notes (Signed)
  Subjective:    CC: Recheck mood  HPI: Anxiety and depression: Improved with the increase to 100 of sertraline, the principal issue is stress at work, he is pushed very hard.  No suicidal or homicidal ideation.  Polyarthralgias: Improved considerably with restarting allopurinol.  Hypertriglyceridemia: Improved from 800-300 with TriCor, we added Lovaza, he has not yet started this.  Past medical history:  Negative.  See flowsheet/record as well for more information.  Surgical history: Negative.  See flowsheet/record as well for more information.  Family history: Negative.  See flowsheet/record as well for more information.  Social history: Negative.  See flowsheet/record as well for more information.  Allergies, and medications have been entered into the medical record, reviewed, and no changes needed.   (To billers/coders, pertinent past medical, social, surgical, family history can be found in problem list, if problem list is marked as reviewed then this indicates that past medical, social, surgical, family history was also reviewed)  Review of Systems: No fevers, chills, night sweats, weight loss, chest pain, or shortness of breath.   Objective:    General: Well Developed, well nourished, and in no acute distress.  Neuro: Alert and oriented x3, extra-ocular muscles intact, sensation grossly intact.  HEENT: Normocephalic, atraumatic, pupils equal round reactive to light, neck supple, no masses, no lymphadenopathy, thyroid nonpalpable.  Skin: Warm and dry, no rashes. Cardiac: Regular rate and rhythm, no murmurs rubs or gallops, no lower extremity edema.  Respiratory: Clear to auscultation bilaterally. Not using accessory muscles, speaking in full sentences.  Impression and Recommendations:    Generalized anxiety disorder Modest improvements in mood. Increasing Zoloft 200 mg, return in 1 month for a PHQ and GAD.  Gout, left intertarsal joints. I do suspect that his widespread  arthralgias were due to subclinical gout flare. This is improved considerably with restarting allopurinol, we can recheck his levels in a couple of months when we recheck his lipids.  Hyperlipidemia Hypertriglyceridemia on TriCor, they were down to 300 from 800, we added Lovaza which he has not yet taken. He will start this and we can recheck fasting lipids in 2 months.  ___________________________________________ Gwen Her. Dianah Field, M.D., ABFM., CAQSM. Primary Care and [place] Instructor of [place] [facility] [employer]

## 2017-04-18 ENCOUNTER — Ambulatory Visit (INDEPENDENT_AMBULATORY_CARE_PROVIDER_SITE_OTHER): Payer: BLUE CROSS/BLUE SHIELD | Admitting: Sports Medicine

## 2017-04-18 ENCOUNTER — Encounter: Payer: Self-pay | Admitting: Sports Medicine

## 2017-04-18 DIAGNOSIS — F411 Generalized anxiety disorder: Secondary | ICD-10-CM | POA: Diagnosis not present

## 2017-04-18 DIAGNOSIS — M79672 Pain in left foot: Secondary | ICD-10-CM | POA: Diagnosis not present

## 2017-04-18 DIAGNOSIS — E783 Hyperchylomicronemia: Secondary | ICD-10-CM | POA: Diagnosis not present

## 2017-04-18 MED ORDER — BUSPIRONE HCL 5 MG PO TABS: 5.0000 mg | ORAL_TABLET | Freq: Two times a day (BID) | ORAL | 3 refills | Status: DC

## 2017-04-18 NOTE — Assessment & Plan Note (Signed)
Continued good improvement in symptoms of anxiety and depression. His uneasiness seems to be more of the issue. Adding BuSpar 5 mg to be used twice daily. Return in 2 months for repeat GAD/PHQ.

## 2017-04-18 NOTE — Progress Notes (Signed)
Subjective:    CC: Recheck anxiety  HPI: This is a pleasant 47 year old male, at the last visit we bumped up his sertraline to 200 mg, he is improved considerably, but still has a minimal baseline level of uneasiness.  Overall he is happy, no suicidal or homicidal ideation.  Hypertriglyceridemia: Currently on fenofibrate and Lovaza, due to recheck triglycerides in about 2 months.  Gout: Improved widespread polyarthralgias with the addition of allopurinol suggesting that he was having widespread subclinical gout flares in all of his joints.  I reviewed the past medical history, family history, social history, surgical history, and allergies today and no changes were needed.  Please see the problem list section below in epic for further details.  Past Medical History: Past Medical History:  Diagnosis Date  . Gout   . Hyperlipidemia    Past Surgical History: Past Surgical History:  Procedure Laterality Date  . CERVICAL FUSION    . HERNIA REPAIR     Social History: Social History   Socioeconomic History  . Marital status: Married    Spouse name: None  . Number of children: None  . Years of education: None  . Highest education level: None  Social Needs  . Financial resource strain: None  . Food insecurity - worry: None  . Food insecurity - inability: None  . Transportation needs - medical: None  . Transportation needs - non-medical: None  Occupational History  . None  Tobacco Use  . Smoking status: Former Smoker    Packs/day: 0.25    Years: 20.00    Pack years: 5.00    Types: Cigarettes  . Smokeless tobacco: Current User    Types: Snuff  Substance and Sexual Activity  . Alcohol use: Yes    Alcohol/week: 6.0 oz    Types: 10 Cans of beer per week  . Drug use: No  . Sexual activity: None  Other Topics Concern  . None  Social History Narrative  . None   Family History: Family History  Problem Relation Age of Onset  . Cancer Mother        colon  . Gout Father    . Stroke Maternal Grandmother   . COPD Maternal Grandfather   . Cancer Paternal Grandmother        lung  . Cancer Paternal Grandfather        colon and lung   Allergies: Allergies  Allergen Reactions  . Penicillins     REACTION: Doesn't know the reaction, but has taken Keflex and Ceftin with no reaction   Medications: See med rec.  Review of Systems: No fevers, chills, night sweats, weight loss, chest pain, or shortness of breath.   Objective:    General: Well Developed, well nourished, and in no acute distress.  Neuro: Alert and oriented x3, extra-ocular muscles intact, sensation grossly intact.  HEENT: Normocephalic, atraumatic, pupils equal round reactive to light, neck supple, no masses, no lymphadenopathy, thyroid nonpalpable.  Skin: Warm and dry, no rashes. Cardiac: Regular rate and rhythm, no murmurs rubs or gallops, no lower extremity edema.  Respiratory: Clear to auscultation bilaterally. Not using accessory muscles, speaking in full sentences.  Impression and Recommendations:    Generalized anxiety disorder Continued good improvement in symptoms of anxiety and depression. His uneasiness seems to be more of the issue. Adding BuSpar 5 mg to be used twice daily. Return in 2 months for repeat GAD/PHQ.  Gout, left intertarsal joints. In 2 months we will recheck his uric acid levels.  He was having widespread arthralgias likely due to subclinical gout flares, these have improved to some degree with allopurinol.  Hyperlipidemia Triglycerides improved from 800 down to 300 with TriCor, we added Lovaza, we are going to recheck his lipids when he returns in 2 months.   I spent 25 minutes with this patient, greater than 50% was face-to-face time counseling regarding the above diagnoses ___________________________________________ Gwen Her. Dianah Field, M.D., ABFM., CAQSM. Primary Care and [place] Instructor of [place] of [facility] of Medicine

## 2017-04-18 NOTE — Assessment & Plan Note (Signed)
Triglycerides improved from 800 down to 300 with TriCor, we added Lovaza, we are going to recheck his lipids when he returns in 2 months.

## 2017-04-18 NOTE — Assessment & Plan Note (Signed)
In 2 months we will recheck his uric acid levels.   He was having widespread arthralgias likely due to subclinical gout flares, these have improved to some degree with allopurinol.

## 2017-06-16 ENCOUNTER — Encounter: Payer: Self-pay | Admitting: Sports Medicine

## 2017-06-16 ENCOUNTER — Ambulatory Visit (INDEPENDENT_AMBULATORY_CARE_PROVIDER_SITE_OTHER): Payer: BLUE CROSS/BLUE SHIELD | Admitting: Sports Medicine

## 2017-06-16 DIAGNOSIS — M79672 Pain in left foot: Secondary | ICD-10-CM | POA: Diagnosis not present

## 2017-06-16 DIAGNOSIS — E783 Hyperchylomicronemia: Secondary | ICD-10-CM | POA: Diagnosis not present

## 2017-06-16 DIAGNOSIS — F411 Generalized anxiety disorder: Secondary | ICD-10-CM

## 2017-06-16 MED ORDER — OMEGA-3-ACID ETHYL ESTERS 1 G PO CAPS: 2.0000 g | ORAL_CAPSULE | Freq: Two times a day (BID) | ORAL | 3 refills | Status: DC

## 2017-06-16 MED ORDER — BUSPIRONE HCL 5 MG PO TABS: 5.0000 mg | ORAL_TABLET | Freq: Two times a day (BID) | ORAL | 3 refills | Status: DC

## 2017-06-16 NOTE — Assessment & Plan Note (Signed)
Rechecking uric acid levels, continue allopurinol. Goal uric acid is less than 5.

## 2017-06-16 NOTE — Progress Notes (Signed)
Subjective:    CC: Follow-up  HPI: Depression and anxiety: Fairly well controlled on 200 mg of sertraline, he did have some residual baseline anxiety so we added BuSpar at the last visit, he was unable to obtain this.  Hypertriglyceridemia: Improved from 800 down to 300 on fenofibrate, we added Lovaza at the last visit, he forgot to pick this up.  Gout: Due to recheck uric acid.  No further flares on allopurinol.  I reviewed the past medical history, family history, social history, surgical history, and allergies today and no changes were needed.  Please see the problem list section below in epic for further details.  Past Medical History: Past Medical History:  Diagnosis Date  . Gout   . Hyperlipidemia    Past Surgical History: Past Surgical History:  Procedure Laterality Date  . CERVICAL FUSION    . HERNIA REPAIR     Social History: Social History   Socioeconomic History  . Marital status: Married    Spouse name: Not on file  . Number of children: Not on file  . Years of education: Not on file  . Highest education level: Not on file  Social Needs  . Financial resource strain: Not on file  . Food insecurity - worry: Not on file  . Food insecurity - inability: Not on file  . Transportation needs - medical: Not on file  . Transportation needs - non-medical: Not on file  Occupational History  . Not on file  Tobacco Use  . Smoking status: Former Smoker    Packs/day: 0.25    Years: 20.00    Pack years: 5.00    Types: Cigarettes  . Smokeless tobacco: Current User    Types: Snuff  Substance and Sexual Activity  . Alcohol use: Yes    Alcohol/week: 6.0 oz    Types: 10 Cans of beer per week  . Drug use: No  . Sexual activity: Not on file  Other Topics Concern  . Not on file  Social History Narrative  . Not on file   Family History: Family History  Problem Relation Age of Onset  . Cancer Mother        colon  . Gout Father   . Stroke Maternal Grandmother   .  COPD Maternal Grandfather   . Cancer Paternal Grandmother        lung  . Cancer Paternal Grandfather        colon and lung   Allergies: Allergies  Allergen Reactions  . Penicillins     REACTION: Doesn't know the reaction, but has taken Keflex and Ceftin with no reaction   Medications: See med rec.  Review of Systems: No fevers, chills, night sweats, weight loss, chest pain, or shortness of breath.   Objective:    General: Well Developed, well nourished, and in no acute distress.  Neuro: Alert and oriented x3, extra-ocular muscles intact, sensation grossly intact.  HEENT: Normocephalic, atraumatic, pupils equal round reactive to light, neck supple, no masses, no lymphadenopathy, thyroid nonpalpable.  Skin: Warm and dry, no rashes. Cardiac: Regular rate and rhythm, no murmurs rubs or gallops, no lower extremity edema.  Respiratory: Clear to auscultation bilaterally. Not using accessory muscles, speaking in full sentences.  Impression and Recommendations:    Hyperlipidemia Rechecking lipids, currently fenofibrate, triglycerides were still in the 300s but down from 800 so we added Lovaza at the last visit, unfortunately he forgot to get it. Once he starts taking it we can recheck triglycerides in 2  months.  Gout, left intertarsal joints. Rechecking uric acid levels, continue allopurinol. Goal uric acid is less than 5.  Generalized anxiety disorder Continue sertraline 200, this provided good improvement in his anxiety and depression, we added BuSpar to be used twice daily at the last visit, there has been a Tree surgeon so he was unable to obtain it, we will keep this on the back burner for now.  ___________________________________________ Gwen Her. Dianah Field, M.D., ABFM., CAQSM. Primary Care and [place] Instructor of [place] of [facility] of Medicine

## 2017-06-16 NOTE — Assessment & Plan Note (Addendum)
Continue sertraline 200, this provided good improvement in his anxiety and depression, we added BuSpar to be used twice daily at the last visit, there has been a Tree surgeon so he was unable to obtain it, we will keep this on the back burner for now.

## 2017-06-16 NOTE — Assessment & Plan Note (Addendum)
Rechecking lipids, currently fenofibrate, triglycerides were still in the 300s but down from 800 so we added Lovaza at the last visit, unfortunately he forgot to get it. Once he starts taking it we can recheck triglycerides in 2 months.

## 2018-03-16 ENCOUNTER — Ambulatory Visit (INDEPENDENT_AMBULATORY_CARE_PROVIDER_SITE_OTHER): Payer: BLUE CROSS/BLUE SHIELD | Admitting: Sports Medicine

## 2018-03-16 ENCOUNTER — Encounter: Payer: Self-pay | Admitting: Sports Medicine

## 2018-03-16 VITALS — BP 129/84 | HR 108 | Ht 69.0 in | Wt 295.0 lb

## 2018-03-16 DIAGNOSIS — M79672 Pain in left foot: Secondary | ICD-10-CM

## 2018-03-16 DIAGNOSIS — G4730 Sleep apnea, unspecified: Secondary | ICD-10-CM | POA: Diagnosis not present

## 2018-03-16 DIAGNOSIS — E669 Obesity, unspecified: Secondary | ICD-10-CM | POA: Diagnosis not present

## 2018-03-16 DIAGNOSIS — E783 Hyperchylomicronemia: Secondary | ICD-10-CM

## 2018-03-16 DIAGNOSIS — F411 Generalized anxiety disorder: Secondary | ICD-10-CM | POA: Diagnosis not present

## 2018-03-16 DIAGNOSIS — L570 Actinic keratosis: Secondary | ICD-10-CM | POA: Diagnosis not present

## 2018-03-16 DIAGNOSIS — Z0000 Encounter for general adult medical examination without abnormal findings: Secondary | ICD-10-CM | POA: Diagnosis not present

## 2018-03-16 MED ORDER — FENOFIBRATE 160 MG PO TABS: 160.0000 mg | ORAL_TABLET | Freq: Every day | ORAL | 1 refills | Status: DC

## 2018-03-16 MED ORDER — ALLOPURINOL 300 MG PO TABS: ORAL_TABLET | ORAL | 1 refills | Status: DC

## 2018-03-16 NOTE — Assessment & Plan Note (Signed)
Referral for sleeve gastrectomy. Checking TSH.

## 2018-03-16 NOTE — Assessment & Plan Note (Signed)
Return for shave biopsy, right dorsal forearm.

## 2018-03-16 NOTE — Assessment & Plan Note (Signed)
Refilling fenofibrate, recheck lipids in 2 months. I am also going to get him set up eventually for gastric sleeve.

## 2018-03-16 NOTE — Assessment & Plan Note (Signed)
Refilling allopurinol, rechecking uric acid levels in 2 months.

## 2018-03-16 NOTE — Assessment & Plan Note (Signed)
Annual physical as above. Checking routine labs. Referral for colonoscopy, mother had an early diagnosis in her 49s.

## 2018-03-16 NOTE — Assessment & Plan Note (Signed)
Self discontinued sertraline, overall feels okay.

## 2018-03-16 NOTE — Assessment & Plan Note (Signed)
Feeling better since restarting CPAP.

## 2018-03-16 NOTE — Progress Notes (Signed)
Subjective:    CC: Follow-up  HPI: Hyperlipidemia: Has been off of fenofibrate.  Gout: Has been off of allopurinol.  Anxiety: Discontinued sertraline, feeling well.  Sleep apnea: Recently restarted his CPAP.  Morbid obesity: Agreeable to look into sleeve gastrectomy.  I reviewed the past medical history, family history, social history, surgical history, and allergies today and no changes were needed.  Please see the problem list section below in epic for further details.  Past Medical History: Past Medical History:  Diagnosis Date  . Gout   . Hyperlipidemia    Past Surgical History: Past Surgical History:  Procedure Laterality Date  . CERVICAL FUSION    . HERNIA REPAIR     Social History: Social History   Socioeconomic History  . Marital status: Married    Spouse name: Not on file  . Number of children: Not on file  . Years of education: Not on file  . Highest education level: Not on file  Occupational History  . Not on file  Social Needs  . Financial resource strain: Not on file  . Food insecurity:    Worry: Not on file    Inability: Not on file  . Transportation needs:    Medical: Not on file    Non-medical: Not on file  Tobacco Use  . Smoking status: Former Smoker    Packs/day: 0.25    Years: 20.00    Pack years: 5.00    Types: Cigarettes  . Smokeless tobacco: Current User    Types: Snuff  Substance and Sexual Activity  . Alcohol use: Yes    Alcohol/week: 10.0 standard drinks    Types: 10 Cans of beer per week  . Drug use: No  . Sexual activity: Not on file  Lifestyle  . Physical activity:    Days per week: Not on file    Minutes per session: Not on file  . Stress: Not on file  Relationships  . Social connections:    Talks on phone: Not on file    Gets together: Not on file    Attends religious service: Not on file    Active member of club or organization: Not on file    Attends meetings of clubs or organizations: Not on file   Relationship status: Not on file  Other Topics Concern  . Not on file  Social History Narrative  . Not on file   Family History: Family History  Problem Relation Age of Onset  . Cancer Mother        colon  . Gout Father   . Stroke Maternal Grandmother   . COPD Maternal Grandfather   . Cancer Paternal Grandmother        lung  . Cancer Paternal Grandfather        colon and lung   Allergies: Allergies  Allergen Reactions  . Penicillins     REACTION: Doesn't know the reaction, but has taken Keflex and Ceftin with no reaction   Medications: See med rec.  Review of Systems: No fevers, chills, night sweats, weight loss, chest pain, or shortness of breath.   Objective:    General: Well Developed, well nourished, and in no acute distress.  Neuro: Alert and oriented x3, extra-ocular muscles intact, sensation grossly intact. Cranial nerves II through XII are intact, motor, sensory, and coordinative functions are all intact. HEENT: Normocephalic, atraumatic, pupils equal round reactive to light, neck supple, no masses, no lymphadenopathy, thyroid nonpalpable. Oropharynx, nasopharynx, external ear canals are unremarkable. Skin:  Warm and dry, no rashes noted.  Suspicious crusted lesion on the right dorsal forearm. Cardiac: Regular rate and rhythm, no murmurs rubs or gallops.  Respiratory: Clear to auscultation bilaterally. Not using accessory muscles, speaking in full sentences.  Abdominal: Soft, nontender, nondistended, positive bowel sounds, no masses, no organomegaly.  Musculoskeletal: Shoulder, elbow, wrist, hip, knee, ankle stable, and with full range of motion.  Impression and Recommendations:    Annual physical exam Annual physical as above. Checking routine labs. Referral for colonoscopy, mother had an early diagnosis in her 81s.  Actinic keratosis Return for shave biopsy, right dorsal forearm.  Generalized anxiety disorder Self discontinued sertraline, overall feels  okay.  Gout, left intertarsal joints. Refilling allopurinol, rechecking uric acid levels in 2 months.  Hyperlipidemia Refilling fenofibrate, recheck lipids in 2 months. I am also going to get him set up eventually for gastric sleeve.  Obesity Referral for sleeve gastrectomy. Checking TSH.  Sleep apnea Feeling better since restarting CPAP. ___________________________________________ Gwen Her. Dianah Field, M.D., ABFM., CAQSM. Primary Care and Sports Medicine [place] [facility]  Adjunct Professor of [place] of [facility] of Medicine

## 2018-03-19 ENCOUNTER — Ambulatory Visit: Payer: BLUE CROSS/BLUE SHIELD | Admitting: Sports Medicine

## 2018-03-23 ENCOUNTER — Ambulatory Visit (INDEPENDENT_AMBULATORY_CARE_PROVIDER_SITE_OTHER): Payer: BLUE CROSS/BLUE SHIELD | Admitting: Sports Medicine

## 2018-03-23 ENCOUNTER — Encounter: Payer: Self-pay | Admitting: Sports Medicine

## 2018-03-23 VITALS — BP 122/76 | HR 108 | Ht 69.0 in | Wt 298.0 lb

## 2018-03-23 DIAGNOSIS — L989 Disorder of the skin and subcutaneous tissue, unspecified: Secondary | ICD-10-CM | POA: Diagnosis not present

## 2018-03-23 DIAGNOSIS — L570 Actinic keratosis: Secondary | ICD-10-CM | POA: Diagnosis not present

## 2018-03-23 NOTE — Progress Notes (Signed)
   Procedure: Shave biopsy of 0.6 cm right dorsal forearm suspicious skin lesion Risks, benefits, and alternatives explained and consent obtained. Time out conducted. Surface prepped with alcohol. 1cc lidocaine with epinephine infiltrated in a field block. Adequate anesthesia ensured. Area prepped and draped in a sterile fashion. Excision performed with: Using a DermaBlade I did a shave into the dermis, I then used hyfrecation for hemostasis. Hemostasis achieved. Pt stable.

## 2018-03-23 NOTE — Assessment & Plan Note (Signed)
Shave biopsy as above, return as needed. 

## 2018-05-26 ENCOUNTER — Encounter: Payer: Self-pay | Admitting: Sports Medicine

## 2018-06-04 ENCOUNTER — Encounter: Payer: Self-pay | Admitting: Sports Medicine

## 2018-06-04 ENCOUNTER — Ambulatory Visit (INDEPENDENT_AMBULATORY_CARE_PROVIDER_SITE_OTHER): Payer: BLUE CROSS/BLUE SHIELD

## 2018-06-04 ENCOUNTER — Ambulatory Visit (INDEPENDENT_AMBULATORY_CARE_PROVIDER_SITE_OTHER): Payer: BLUE CROSS/BLUE SHIELD | Admitting: Sports Medicine

## 2018-06-04 DIAGNOSIS — W010XXA Fall on same level from slipping, tripping and stumbling without subsequent striking against object, initial encounter: Secondary | ICD-10-CM | POA: Diagnosis not present

## 2018-06-04 DIAGNOSIS — S8992XA Unspecified injury of left lower leg, initial encounter: Secondary | ICD-10-CM | POA: Diagnosis not present

## 2018-06-04 DIAGNOSIS — M25561 Pain in right knee: Secondary | ICD-10-CM | POA: Diagnosis not present

## 2018-06-04 DIAGNOSIS — M25562 Pain in left knee: Secondary | ICD-10-CM | POA: Diagnosis not present

## 2018-06-04 DIAGNOSIS — M170 Bilateral primary osteoarthritis of knee: Secondary | ICD-10-CM | POA: Insufficient documentation

## 2018-06-04 DIAGNOSIS — S8991XA Unspecified injury of right lower leg, initial encounter: Secondary | ICD-10-CM | POA: Diagnosis not present

## 2018-06-04 MED ORDER — TRAMADOL HCL 50 MG PO TABS: 50.0000 mg | ORAL_TABLET | Freq: Three times a day (TID) | ORAL | 0 refills | Status: DC | PRN

## 2018-06-04 NOTE — Assessment & Plan Note (Addendum)
Injury occurred 2 to 3 weeks ago. Persistent severe pain. He does have signs consistent with MCL injury and hamstring injury. Also possible patellofemoral chondral injury. Significant pain, mechanical symptoms, instability. X-rays today, MRI. The knee was strapped with a hinged brace. Tramadol for pain. Return to see me in 2 weeks.

## 2018-06-04 NOTE — Progress Notes (Signed)
Subjective:    CC: Left knee pain  HPI: Several weeks ago this pleasant 48 year old male was helping a friend, he slipped in the mud and his knee hyperextended.  He had immediate pain, swelling and inability/great difficulty bearing weight.  He waited and unfortunately the pain did not improve.  He gets sensation of instability, occasional mechanical symptoms and locking.  Pain is predominantly on the posterior medial joint line.  Persistent, localized without radiation.  I reviewed the past medical history, family history, social history, surgical history, and allergies today and no changes were needed.  Please see the problem list section below in epic for further details.  Past Medical History: Past Medical History:  Diagnosis Date  . Gout   . Hyperlipidemia    Past Surgical History: Past Surgical History:  Procedure Laterality Date  . CERVICAL FUSION    . HERNIA REPAIR     Social History: Social History   Socioeconomic History  . Marital status: Married    Spouse name: Not on file  . Number of children: Not on file  . Years of education: Not on file  . Highest education level: Not on file  Occupational History  . Not on file  Social Needs  . Financial resource strain: Not on file  . Food insecurity:    Worry: Not on file    Inability: Not on file  . Transportation needs:    Medical: Not on file    Non-medical: Not on file  Tobacco Use  . Smoking status: Former Smoker    Packs/day: 0.25    Years: 20.00    Pack years: 5.00    Types: Cigarettes  . Smokeless tobacco: Current User    Types: Snuff  Substance and Sexual Activity  . Alcohol use: Yes    Alcohol/week: 10.0 standard drinks    Types: 10 Cans of beer per week  . Drug use: No  . Sexual activity: Not on file  Lifestyle  . Physical activity:    Days per week: Not on file    Minutes per session: Not on file  . Stress: Not on file  Relationships  . Social connections:    Talks on phone: Not on file    Gets together: Not on file    Attends religious service: Not on file    Active member of club or organization: Not on file    Attends meetings of clubs or organizations: Not on file    Relationship status: Not on file  Other Topics Concern  . Not on file  Social History Narrative  . Not on file   Family History: Family History  Problem Relation Age of Onset  . Cancer Mother        colon  . Gout Father   . Stroke Maternal Grandmother   . COPD Maternal Grandfather   . Cancer Paternal Grandmother        lung  . Cancer Paternal Grandfather        colon and lung   Allergies: Allergies  Allergen Reactions  . Penicillins     REACTION: Doesn't know the reaction, but has taken Keflex and Ceftin with no reaction   Medications: See med rec.  Review of Systems: No fevers, chills, night sweats, weight loss, chest pain, or shortness of breath.   Objective:    General: Well Developed, well nourished, and in no acute distress.  Neuro: Alert and oriented x3, extra-ocular muscles intact, sensation grossly intact.  HEENT: Normocephalic, atraumatic, pupils  equal round reactive to light, neck supple, no masses, no lymphadenopathy, thyroid nonpalpable.  Skin: Warm and dry, no rashes. Cardiac: Regular rate and rhythm, no murmurs rubs or gallops, no lower extremity edema.  Respiratory: Clear to auscultation bilaterally. Not using accessory muscles, speaking in full sentences. Left knee: Normal to inspection with no erythema or effusion or obvious bony abnormalities. Palpation normal with no warmth or joint line tenderness or patellar tenderness or condyle tenderness. ROM normal in flexion and extension and lower leg rotation. Ligaments with solid consistent endpoints including ACL, PCL, LCL, MCL, there is reproduction of pain with application of valgus stress. Negative Mcmurray's and provocative meniscal tests. Non painful patellar compression. Patellar and quadriceps tendons  unremarkable. There is also severe tenderness over the hamstring tendons, reproduction of pain with resisted flexion of the knee.  X-rays personally reviewed, overall unremarkable.  Impression and Recommendations:    Left knee injury Injury occurred 2 to 3 weeks ago. Persistent severe pain. He does have signs consistent with MCL injury and hamstring injury. Also possible patellofemoral chondral injury. Significant pain, mechanical symptoms, instability. X-rays today, MRI. The knee was strapped with a hinged brace. Tramadol for pain. Return to see me in 2 weeks. ___________________________________________ Gwen Her. Dianah Field, M.D., ABFM., CAQSM. Primary Care and Sports Medicine [place] [facility]  Adjunct Professor of [place] of [facility] of Medicine

## 2018-06-05 ENCOUNTER — Telehealth: Payer: Self-pay | Admitting: Sports Medicine

## 2018-06-05 NOTE — Telephone Encounter (Signed)
Spoke with [employer] to get authorization for MRI of left knee w/o contrast. Per [employer] no authorization is needed. Notified Helen at [employer]. KG LPN

## 2018-06-19 ENCOUNTER — Ambulatory Visit (INDEPENDENT_AMBULATORY_CARE_PROVIDER_SITE_OTHER): Payer: BLUE CROSS/BLUE SHIELD | Admitting: Sports Medicine

## 2018-06-19 ENCOUNTER — Other Ambulatory Visit: Payer: Self-pay

## 2018-06-19 ENCOUNTER — Encounter: Payer: Self-pay | Admitting: Sports Medicine

## 2018-06-19 ENCOUNTER — Ambulatory Visit: Payer: BLUE CROSS/BLUE SHIELD | Admitting: Sports Medicine

## 2018-06-19 DIAGNOSIS — S8992XD Unspecified injury of left lower leg, subsequent encounter: Secondary | ICD-10-CM

## 2018-06-19 MED ORDER — HYDROCODONE-ACETAMINOPHEN 5-325 MG PO TABS: 1.0000 | ORAL_TABLET | Freq: Three times a day (TID) | ORAL | 0 refills | Status: DC | PRN

## 2018-06-19 NOTE — Progress Notes (Addendum)
Subjective:    CC: Knee pain  HPI: Sailor returns, he never got a phone call to schedule his MRI, he is still in significant pain, tramadol caused nausea.  I reviewed the past medical history, family history, social history, surgical history, and allergies today and no changes were needed.  Please see the problem list section below in epic for further details.  Past Medical History: Past Medical History:  Diagnosis Date  . Gout   . Hyperlipidemia    Past Surgical History: Past Surgical History:  Procedure Laterality Date  . CERVICAL FUSION    . HERNIA REPAIR     Social History: Social History   Socioeconomic History  . Marital status: Married    Spouse name: Not on file  . Number of children: Not on file  . Years of education: Not on file  . Highest education level: Not on file  Occupational History  . Not on file  Social Needs  . Financial resource strain: Not on file  . Food insecurity:    Worry: Not on file    Inability: Not on file  . Transportation needs:    Medical: Not on file    Non-medical: Not on file  Tobacco Use  . Smoking status: Former Smoker    Packs/day: 0.25    Years: 20.00    Pack years: 5.00    Types: Cigarettes  . Smokeless tobacco: Current User    Types: Snuff  Substance and Sexual Activity  . Alcohol use: Yes    Alcohol/week: 10.0 standard drinks    Types: 10 Cans of beer per week  . Drug use: No  . Sexual activity: Not on file  Lifestyle  . Physical activity:    Days per week: Not on file    Minutes per session: Not on file  . Stress: Not on file  Relationships  . Social connections:    Talks on phone: Not on file    Gets together: Not on file    Attends religious service: Not on file    Active member of club or organization: Not on file    Attends meetings of clubs or organizations: Not on file    Relationship status: Not on file  Other Topics Concern  . Not on file  Social History Narrative  . Not on file   Family  History: Family History  Problem Relation Age of Onset  . Cancer Mother        colon  . Gout Father   . Stroke Maternal Grandmother   . COPD Maternal Grandfather   . Cancer Paternal Grandmother        lung  . Cancer Paternal Grandfather        colon and lung   Allergies: Allergies  Allergen Reactions  . Penicillins     REACTION: Doesn't know the reaction, but has taken Keflex and Ceftin with no reaction   Medications: See med rec.  Review of Systems: No fevers, chills, night sweats, weight loss, chest pain, or shortness of breath.   Objective:    General: Well Developed, well nourished, and in no acute distress.  Neuro: Alert and oriented x3, extra-ocular muscles intact, sensation grossly intact.  HEENT: Normocephalic, atraumatic, pupils equal round reactive to light, neck supple, no masses, no lymphadenopathy, thyroid nonpalpable.  Skin: Warm and dry, no rashes. Cardiac: Regular rate and rhythm, no murmurs rubs or gallops, no lower extremity edema.  Respiratory: Clear to auscultation bilaterally. Not using accessory muscles, speaking in  full sentences.  Impression and Recommendations:    Left knee injury Persistent pain now for almost 6 weeks. Clinical suspicion was MCL injury, hamstring injury, as well as patellofemoral chondral injury. X-rays were unrevealing. The MRI was approved but he was not contacted, I am sending him downstairs to discuss scheduling. Switching to hydrocodone for pain control in the meantime. Return to see me to go over MRI results.  MCL strain and medial meniscal tearing.  Considering duration of symptoms is likely the meniscal tear causing persistent pain, considering this is a small undersurface tear without a displaced fragment we can consider an injection before referring him for arthroscopy.   ___________________________________________ Gwen Her. Dianah Field, M.D., ABFM., CAQSM. Primary Care and Sports Medicine [facility]  [facility]  Adjunct Professor of [place] of [facility] of Medicine

## 2018-06-19 NOTE — Assessment & Plan Note (Addendum)
Persistent pain now for almost 6 weeks. Clinical suspicion was MCL injury, hamstring injury, as well as patellofemoral chondral injury. X-rays were unrevealing. The MRI was approved but he was not contacted, I am sending him downstairs to discuss scheduling. Switching to hydrocodone for pain control in the meantime. Return to see me to go over MRI results.  MCL strain and medial meniscal tearing.  Considering duration of symptoms is likely the meniscal tear causing persistent pain, considering this is a small undersurface tear without a displaced fragment we can consider an injection before referring him for arthroscopy.

## 2018-06-22 ENCOUNTER — Ambulatory Visit (INDEPENDENT_AMBULATORY_CARE_PROVIDER_SITE_OTHER): Payer: BLUE CROSS/BLUE SHIELD

## 2018-06-22 ENCOUNTER — Other Ambulatory Visit: Payer: Self-pay

## 2018-06-22 DIAGNOSIS — W010XXD Fall on same level from slipping, tripping and stumbling without subsequent striking against object, subsequent encounter: Secondary | ICD-10-CM

## 2018-06-22 DIAGNOSIS — S83242D Other tear of medial meniscus, current injury, left knee, subsequent encounter: Secondary | ICD-10-CM | POA: Diagnosis not present

## 2018-06-22 DIAGNOSIS — M25562 Pain in left knee: Secondary | ICD-10-CM

## 2018-06-22 DIAGNOSIS — S83242A Other tear of medial meniscus, current injury, left knee, initial encounter: Secondary | ICD-10-CM | POA: Diagnosis not present

## 2018-06-22 DIAGNOSIS — S8992XA Unspecified injury of left lower leg, initial encounter: Secondary | ICD-10-CM

## 2018-09-09 ENCOUNTER — Other Ambulatory Visit: Payer: Self-pay | Admitting: Sports Medicine

## 2019-02-15 ENCOUNTER — Other Ambulatory Visit: Payer: Self-pay

## 2019-02-15 ENCOUNTER — Encounter: Payer: Self-pay | Admitting: Sports Medicine

## 2019-02-15 ENCOUNTER — Ambulatory Visit (INDEPENDENT_AMBULATORY_CARE_PROVIDER_SITE_OTHER): Payer: BC Managed Care – PPO | Admitting: Sports Medicine

## 2019-02-15 DIAGNOSIS — M79672 Pain in left foot: Secondary | ICD-10-CM

## 2019-02-15 DIAGNOSIS — M10072 Idiopathic gout, left ankle and foot: Secondary | ICD-10-CM | POA: Diagnosis not present

## 2019-02-15 MED ORDER — COLCHICINE 0.6 MG PO TABS: 0.6000 mg | ORAL_TABLET | Freq: Two times a day (BID) | ORAL | 3 refills | Status: DC

## 2019-02-15 MED ORDER — INDOMETHACIN 50 MG PO CAPS: 50.0000 mg | ORAL_CAPSULE | Freq: Two times a day (BID) | ORAL | 3 refills | Status: DC

## 2019-02-15 NOTE — Assessment & Plan Note (Signed)
Suspected gout, we have not obtained a crystal analysis. Today it looks like a calcaneal bursitis, he did indulge over the weekend with steak and beer. Adding indomethacin and colchicine, he will continue his allopurinol, we can recheck uric acid levels at the follow-up visit in 1 month and adjust allopurinol for a target of 5 or less.

## 2019-02-15 NOTE — Progress Notes (Signed)
Subjective:    CC: Gout flareup  HPI: This is a pleasant 48 year old male, over the weekend he overindulged with steak and beer, he then developed severe pain on the posterior aspect of his left heel, localized without radiation.  He has been consistent with his allopurinol.  Pain is severe, persistent,  I reviewed the past medical history, family history, social history, surgical history, and allergies today and no changes were needed.  Please see the problem list section below in epic for further details.  Past Medical History: Past Medical History:  Diagnosis Date  . Gout   . Hyperlipidemia    Past Surgical History: Past Surgical History:  Procedure Laterality Date  . CERVICAL FUSION    . HERNIA REPAIR     Social History: Social History   Socioeconomic History  . Marital status: Married    Spouse name: Not on file  . Number of children: Not on file  . Years of education: Not on file  . Highest education level: Not on file  Occupational History  . Not on file  Social Needs  . Financial resource strain: Not on file  . Food insecurity    Worry: Not on file    Inability: Not on file  . Transportation needs    Medical: Not on file    Non-medical: Not on file  Tobacco Use  . Smoking status: Former Smoker    Packs/day: 0.25    Years: 20.00    Pack years: 5.00    Types: Cigarettes  . Smokeless tobacco: Current User    Types: Snuff  Substance and Sexual Activity  . Alcohol use: Yes    Alcohol/week: 10.0 standard drinks    Types: 10 Cans of beer per week  . Drug use: No  . Sexual activity: Not on file  Lifestyle  . Physical activity    Days per week: Not on file    Minutes per session: Not on file  . Stress: Not on file  Relationships  . Social Herbalist on phone: Not on file    Gets together: Not on file    Attends religious service: Not on file    Active member of club or organization: Not on file    Attends meetings of clubs or organizations:  Not on file    Relationship status: Not on file  Other Topics Concern  . Not on file  Social History Narrative  . Not on file   Family History: Family History  Problem Relation Age of Onset  . Cancer Mother        colon  . Gout Father   . Stroke Maternal Grandmother   . COPD Maternal Grandfather   . Cancer Paternal Grandmother        lung  . Cancer Paternal Grandfather        colon and lung   Allergies: Allergies  Allergen Reactions  . Penicillins     REACTION: Doesn't know the reaction, but has taken Keflex and Ceftin with no reaction   Medications: See med rec.  Review of Systems: No fevers, chills, night sweats, weight loss, chest pain, or shortness of breath.   Objective:    General: Well Developed, well nourished, and in no acute distress.  Neuro: Alert and oriented x3, extra-ocular muscles intact, sensation grossly intact.  HEENT: Normocephalic, atraumatic, pupils equal round reactive to light, neck supple, no masses, no lymphadenopathy, thyroid nonpalpable.  Skin: Warm and dry, no rashes. Cardiac: Regular rate  and rhythm, no murmurs rubs or gallops, no lower extremity edema.  Respiratory: Clear to auscultation bilaterally. Not using accessory muscles, speaking in full sentences. Left foot: Severe pain and severe tenderness to palpation over the calcaneal bursa, mild erythema. Range of motion is full in all directions. Strength is 5/5 in all directions. No hallux valgus. No pes cavus or pes planus. No abnormal callus noted. No pain over the navicular prominence, or base of fifth metatarsal. No tenderness to palpation of the calcaneal insertion of plantar fascia. No pain at the Achilles insertion. No pain of the retrocalcaneal bursa. No tenderness to palpation over the tarsals, metatarsals, or phalanges. No hallux rigidus or limitus. No tenderness palpation over interphalangeal joints. No pain with compression of the metatarsal heads. Neurovascularly intact  distally.  Impression and Recommendations:    Gout attack Suspected gout, we have not obtained a crystal analysis. Today it looks like a calcaneal bursitis, he did indulge over the weekend with steak and beer. Adding indomethacin and colchicine, he will continue his allopurinol, we can recheck uric acid levels at the follow-up visit in 1 month and adjust allopurinol for a target of 5 or less.   ___________________________________________ Gwen Her. Dianah Field, M.D., ABFM., CAQSM. Primary Care and Sports Medicine [facility]  Adjunct Professor of [place] of [facility] of Medicine

## 2019-03-15 ENCOUNTER — Encounter: Payer: Self-pay | Admitting: Sports Medicine

## 2019-03-15 ENCOUNTER — Ambulatory Visit (INDEPENDENT_AMBULATORY_CARE_PROVIDER_SITE_OTHER): Payer: BC Managed Care – PPO | Admitting: Sports Medicine

## 2019-03-15 ENCOUNTER — Other Ambulatory Visit: Payer: Self-pay

## 2019-03-15 VITALS — BP 121/77 | HR 92 | Ht 69.0 in | Wt 287.0 lb

## 2019-03-15 DIAGNOSIS — S8992XD Unspecified injury of left lower leg, subsequent encounter: Secondary | ICD-10-CM

## 2019-03-15 DIAGNOSIS — F411 Generalized anxiety disorder: Secondary | ICD-10-CM | POA: Diagnosis not present

## 2019-03-15 DIAGNOSIS — Z0000 Encounter for general adult medical examination without abnormal findings: Secondary | ICD-10-CM

## 2019-03-15 DIAGNOSIS — E783 Hyperchylomicronemia: Secondary | ICD-10-CM | POA: Diagnosis not present

## 2019-03-15 DIAGNOSIS — D649 Anemia, unspecified: Secondary | ICD-10-CM

## 2019-03-15 DIAGNOSIS — M10072 Idiopathic gout, left ankle and foot: Secondary | ICD-10-CM | POA: Diagnosis not present

## 2019-03-15 MED ORDER — SERTRALINE HCL 50 MG PO TABS: 50.0000 mg | ORAL_TABLET | Freq: Every day | ORAL | 3 refills | Status: DC

## 2019-03-15 NOTE — Assessment & Plan Note (Signed)
Rechecking lipids, nonfasting.

## 2019-03-15 NOTE — Assessment & Plan Note (Signed)
Meniscal tearing medially, osteoarthritis, injected today. Return in 1 month.

## 2019-03-15 NOTE — Assessment & Plan Note (Signed)
Routine physical as above today.

## 2019-03-15 NOTE — Assessment & Plan Note (Signed)
Had self discontinued sertraline about a year ago, now with increasing stress, restarting medication.

## 2019-03-15 NOTE — Progress Notes (Addendum)
Subjective:    CC: Annual physical and several complaints  HPI:  Dakota Garcia needs his physical done today.  Gout: Resolved.  Knee pain: Left-sided, we did an MRI earlier this year and he never followed up secondary to the pandemic.  Pain is at the medial joint line, severe, persistent, localized without radiation.  Generalized anxiety: Historically well controlled with sertraline, he self discontinued sometime earlier in the year, now he feels as though he needs to restart it, no suicidal or homicidal ideation.  I reviewed the past medical history, family history, social history, surgical history, and allergies today and no changes were needed.  Please see the problem list section below in epic for further details.  Past Medical History: Past Medical History:  Diagnosis Date  . Gout   . Hyperlipidemia    Past Surgical History: Past Surgical History:  Procedure Laterality Date  . CERVICAL FUSION    . HERNIA REPAIR     Social History: Social History   Socioeconomic History  . Marital status: Married    Spouse name: Not on file  . Number of children: Not on file  . Years of education: Not on file  . Highest education level: Not on file  Occupational History  . Not on file  Tobacco Use  . Smoking status: Former Smoker    Packs/day: 0.25    Years: 20.00    Pack years: 5.00    Types: Cigarettes  . Smokeless tobacco: Current User    Types: Snuff  Substance and Sexual Activity  . Alcohol use: Yes    Alcohol/week: 10.0 standard drinks    Types: 10 Cans of beer per week  . Drug use: No  . Sexual activity: Not on file  Other Topics Concern  . Not on file  Social History Narrative  . Not on file   Social Determinants of Health   Financial Resource Strain:   . Difficulty of Paying Living Expenses: Not on file  Food Insecurity:   . Worried About Charity fundraiser in the Last Year: Not on file  . Ran Out of Food in the Last Year: Not on file  Transportation Needs:    . Lack of Transportation (Medical): Not on file  . Lack of Transportation (Non-Medical): Not on file  Physical Activity:   . Days of Exercise per Week: Not on file  . Minutes of Exercise per Session: Not on file  Stress:   . Feeling of Stress : Not on file  Social Connections:   . Frequency of Communication with Friends and Family: Not on file  . Frequency of Social Gatherings with Friends and Family: Not on file  . Attends Religious Services: Not on file  . Active Member of Clubs or Organizations: Not on file  . Attends Archivist Meetings: Not on file  . Marital Status: Not on file   Family History: Family History  Problem Relation Age of Onset  . Cancer Mother        colon  . Gout Father   . Stroke Maternal Grandmother   . COPD Maternal Grandfather   . Cancer Paternal Grandmother        lung  . Cancer Paternal Grandfather        colon and lung   Allergies: Allergies  Allergen Reactions  . Penicillins     REACTION: Doesn't know the reaction, but has taken Keflex and Ceftin with no reaction   Medications: See med rec.  Review of Systems:  No headache, visual changes, nausea, vomiting, diarrhea, constipation, dizziness, abdominal pain, skin rash, fevers, chills, night sweats, swollen lymph nodes, weight loss, chest pain, body aches, joint swelling, muscle aches, shortness of breath, mood changes, visual or auditory hallucinations.  Objective:    General: Well Developed, well nourished, and in no acute distress.  Neuro: Alert and oriented x3, extra-ocular muscles intact, sensation grossly intact.  HEENT: Normocephalic, atraumatic, pupils equal round reactive to light, neck supple, no masses, no lymphadenopathy, thyroid nonpalpable.  Skin: Warm and dry, no rashes noted.  Cardiac: Regular rate and rhythm, no murmurs rubs or gallops.  Respiratory: Clear to auscultation bilaterally. Not using accessory muscles, speaking in full sentences.  Abdominal: Soft,  nontender, nondistended, positive bowel sounds, no masses, no organomegaly.  Left knee: Mild swelling, tender to palpation at the medial joint line. ROM normal in flexion and extension and lower leg rotation. Ligaments with solid consistent endpoints including ACL, PCL, LCL, MCL. Negative Mcmurray's and provocative meniscal tests. Non painful patellar compression. Patellar and quadriceps tendons unremarkable. Hamstring and quadriceps strength is normal.  Procedure: Real-time Ultrasound Guided injection of the left knee Device: Samsung HS60  Verbal informed consent obtained.  Time-out conducted.  Noted no overlying erythema, induration, or other signs of local infection.  Skin prepped in a sterile fashion.  Local anesthesia: Topical Ethyl chloride.  With sterile technique and under real time ultrasound guidance: 1 cc Kenalog 40, 2 cc lidocaine, 2 cc bupivacaine injected easily Completed without difficulty  Pain immediately resolved suggesting accurate placement of the medication.  Advised to call if fevers/chills, erythema, induration, drainage, or persistent bleeding.  Images permanently stored and available for review in the ultrasound unit.  Impression: Technically successful ultrasound guided injection.  Impression and Recommendations:    The patient was counselled, risk factors were discussed, anticipatory guidance given.  Left knee injury Meniscal tearing medially, osteoarthritis, injected today. Return in 1 month.  Gout attack Calcaneal bursitis, after steak and beer. Continue allopurinol, indomethacin and colchicine as needed. Rechecking uric acid levels today.  Hyperlipidemia Rechecking lipids, nonfasting.  Generalized anxiety disorder Had self discontinued sertraline about a year ago, now with increasing stress, restarting medication.  Annual physical exam Routine physical as above today.  Normocytic anemia Adding anemia panel.  This does not look like an  iron deficiency anemia but he is certainly going to be due for colonoscopy so I am going to go ahead and do a referral to GI, he will probably get an upper and lower endoscopy.   ___________________________________________ Gwen Her. Dianah Field, M.D., ABFM., CAQSM. Primary Care and Sports Medicine [facility]  Adjunct Professor of [employer] of [facility] of Medicine

## 2019-03-15 NOTE — Assessment & Plan Note (Signed)
Calcaneal bursitis, after steak and beer. Continue allopurinol, indomethacin and colchicine as needed. Rechecking uric acid levels today.

## 2019-03-16 DIAGNOSIS — D649 Anemia, unspecified: Secondary | ICD-10-CM | POA: Insufficient documentation

## 2019-03-16 NOTE — Assessment & Plan Note (Addendum)
Adding anemia panel.  This does not look like an iron deficiency anemia but he is certainly going to be due for colonoscopy so I am going to go ahead and do a referral to GI, he will probably get an upper and lower endoscopy.

## 2019-03-16 NOTE — Addendum Note (Signed)
Addended by: Silverio Decamp on: 03/16/2019 08:42 AM   Modules accepted: Orders

## 2019-03-19 LAB — CBC
HCT: 37.9 % — ABNORMAL LOW (ref 38.5–50.0)
Hemoglobin: 13 g/dL — ABNORMAL LOW (ref 13.2–17.1)
MCH: 31.9 pg (ref 27.0–33.0)
MCHC: 34.3 g/dL (ref 32.0–36.0)
MCV: 92.9 fL (ref 80.0–100.0)
MPV: 10.5 fL (ref 7.5–12.5)
Platelets: 196 10*3/uL (ref 140–400)
RBC: 4.08 10*6/uL — ABNORMAL LOW (ref 4.20–5.80)
RDW: 12 % (ref 11.0–15.0)
WBC: 6.1 10*3/uL (ref 3.8–10.8)

## 2019-03-19 LAB — COMPLETE METABOLIC PANEL WITH GFR
AG Ratio: 1.6 (calc) (ref 1.0–2.5)
ALT: 58 U/L — ABNORMAL HIGH (ref 9–46)
AST: 36 U/L — ABNORMAL HIGH (ref 10–35)
Albumin: 4.5 g/dL (ref 3.6–5.1)
Alkaline phosphatase (APISO): 88 U/L (ref 35–144)
BUN: 16 mg/dL (ref 7–25)
CO2: 25 mmol/L (ref 20–32)
Calcium: 9.6 mg/dL (ref 8.6–10.3)
Chloride: 105 mmol/L (ref 98–110)
Creat: 0.94 mg/dL (ref 0.70–1.33)
GFR, Est African American: 109 mL/min/{1.73_m2} (ref >=60)
GFR, Est Non African American: 94 mL/min/{1.73_m2} (ref >=60)
Globulin: 2.8 g/dL (calc) (ref 1.9–3.7)
Glucose, Bld: 123 mg/dL — ABNORMAL HIGH (ref 65–99)
Potassium: 4.2 mmol/L (ref 3.5–5.3)
Sodium: 141 mmol/L (ref 135–146)
Total Bilirubin: 0.4 mg/dL (ref 0.2–1.2)
Total Protein: 7.3 g/dL (ref 6.1–8.1)

## 2019-03-19 LAB — LIPID PANEL W/REFLEX DIRECT LDL
Cholesterol: 185 mg/dL (ref <200)
HDL: 34 mg/dL — ABNORMAL LOW (ref >=40)
LDL Cholesterol (Calc): 107 mg/dL (calc) — ABNORMAL HIGH
Non-HDL Cholesterol (Calc): 151 mg/dL (calc) — ABNORMAL HIGH (ref <130)
Total CHOL/HDL Ratio: 5.4 (calc) — ABNORMAL HIGH (ref <5.0)
Triglycerides: 327 mg/dL — ABNORMAL HIGH (ref <150)

## 2019-03-19 LAB — TEST AUTHORIZATION

## 2019-03-19 LAB — B12 AND FOLATE PANEL
Folate: 15.8 ng/mL
Vitamin B-12: 405 pg/mL (ref 200–1100)

## 2019-03-19 LAB — IRON,TIBC AND FERRITIN PANEL
%SAT: 27 % (calc) (ref 20–48)
Ferritin: 549 ng/mL — ABNORMAL HIGH (ref 38–380)
Iron: 108 ug/dL (ref 50–180)
TIBC: 395 mcg/dL (calc) (ref 250–425)

## 2019-03-19 LAB — TSH: TSH: 2.3 mIU/L (ref 0.40–4.50)

## 2019-03-19 LAB — URIC ACID: Uric Acid, Serum: 6.3 mg/dL (ref 4.0–8.0)

## 2019-03-19 NOTE — Addendum Note (Signed)
Addended by: Silverio Decamp on: 03/19/2019 09:25 AM   Modules accepted: Orders

## 2019-04-05 ENCOUNTER — Other Ambulatory Visit: Payer: Self-pay | Admitting: Neurology

## 2019-04-05 DIAGNOSIS — E783 Hyperchylomicronemia: Secondary | ICD-10-CM

## 2019-04-05 MED ORDER — FENOFIBRATE 160 MG PO TABS: 160.0000 mg | ORAL_TABLET | Freq: Every day | ORAL | 1 refills | Status: DC

## 2019-04-06 ENCOUNTER — Other Ambulatory Visit: Payer: Self-pay | Admitting: Sports Medicine

## 2019-04-06 DIAGNOSIS — F411 Generalized anxiety disorder: Secondary | ICD-10-CM

## 2019-04-12 ENCOUNTER — Encounter: Payer: Self-pay | Admitting: Sports Medicine

## 2019-04-12 ENCOUNTER — Ambulatory Visit (INDEPENDENT_AMBULATORY_CARE_PROVIDER_SITE_OTHER): Payer: BC Managed Care – PPO | Admitting: Sports Medicine

## 2019-04-12 ENCOUNTER — Other Ambulatory Visit: Payer: Self-pay

## 2019-04-12 DIAGNOSIS — F411 Generalized anxiety disorder: Secondary | ICD-10-CM | POA: Diagnosis not present

## 2019-04-12 DIAGNOSIS — S8992XD Unspecified injury of left lower leg, subsequent encounter: Secondary | ICD-10-CM

## 2019-04-12 MED ORDER — SERTRALINE HCL 100 MG PO TABS: 100.0000 mg | ORAL_TABLET | Freq: Every day | ORAL | 3 refills | Status: DC

## 2019-04-12 NOTE — Assessment & Plan Note (Signed)
MRI shows meniscal tearing medially and osteoarthritis, I injected at the last visit and he returns today for the most part pain-free. Return as needed for this.

## 2019-04-12 NOTE — Assessment & Plan Note (Signed)
Dakota Garcia returns, he really has not noted much improvement with low-dose sertraline, no suicidal or homicidal ideation but still significant anxiety, stress and irritability, with issues at work and at home. Symptoms are not at goal. He declines behavioral therapy. Increasing to 100 mg, return in 4 weeks for a PHQ/GAD.

## 2019-04-12 NOTE — Progress Notes (Signed)
    Procedures performed today:    None.  Independent interpretation of tests performed by another provider:   None.  Impression and Recommendations:    Generalized anxiety disorder Coron returns, he really has not noted much improvement with low-dose sertraline, no suicidal or homicidal ideation but still significant anxiety, stress and irritability, with issues at work and at home. Symptoms are not at goal. He declines behavioral therapy. Increasing to 100 mg, return in 4 weeks for a PHQ/GAD.   Left knee injury MRI shows meniscal tearing medially and osteoarthritis, I injected at the last visit and he returns today for the most part pain-free. Return as needed for this.    ___________________________________________ Gwen Her. Dianah Field, M.D., ABFM., CAQSM. Primary Care and [place] Instructor of [place] of [facility] of Medicine

## 2019-04-27 ENCOUNTER — Other Ambulatory Visit: Payer: Self-pay | Admitting: *Deleted

## 2019-04-27 DIAGNOSIS — F411 Generalized anxiety disorder: Secondary | ICD-10-CM

## 2019-04-27 MED ORDER — SERTRALINE HCL 100 MG PO TABS: 100.0000 mg | ORAL_TABLET | Freq: Every day | ORAL | 0 refills | Status: DC

## 2019-05-05 ENCOUNTER — Ambulatory Visit: Payer: BC Managed Care – PPO | Admitting: Gastroenterology

## 2019-05-06 ENCOUNTER — Ambulatory Visit (INDEPENDENT_AMBULATORY_CARE_PROVIDER_SITE_OTHER): Payer: BC Managed Care – PPO | Admitting: Nurse Practitioner

## 2019-05-06 ENCOUNTER — Encounter: Payer: Self-pay | Admitting: Nurse Practitioner

## 2019-05-06 ENCOUNTER — Other Ambulatory Visit: Payer: Self-pay

## 2019-05-06 VITALS — BP 134/82 | HR 86 | Temp 97.4°F | Ht 68.0 in | Wt 289.0 lb

## 2019-05-06 DIAGNOSIS — Z800 Family history of malignant neoplasm of digestive organs: Secondary | ICD-10-CM

## 2019-05-06 DIAGNOSIS — D508 Other iron deficiency anemias: Secondary | ICD-10-CM | POA: Diagnosis not present

## 2019-05-06 DIAGNOSIS — Z01818 Encounter for other preprocedural examination: Secondary | ICD-10-CM

## 2019-05-06 DIAGNOSIS — K921 Melena: Secondary | ICD-10-CM | POA: Insufficient documentation

## 2019-05-06 DIAGNOSIS — R7989 Other specified abnormal findings of blood chemistry: Secondary | ICD-10-CM

## 2019-05-06 MED ORDER — OMEPRAZOLE 20 MG PO CPDR: 20.0000 mg | DELAYED_RELEASE_CAPSULE | Freq: Every day | ORAL | 1 refills | Status: DC

## 2019-05-06 MED ORDER — CLENPIQ 10-3.5-12 MG-GM -GM/160ML PO SOLN: 1.0000 | ORAL | 0 refills | Status: DC

## 2019-05-06 NOTE — Patient Instructions (Addendum)
If you are age 51 or older, your body mass index should be between 23-30. Your Body mass index is 43.94 kg/m. If this is out of the aforementioned range listed, please consider follow up with your Primary Care Provider.  If you are age 46 or younger, your body mass index should be between 19-25. Your Body mass index is 43.94 kg/m. If this is out of the aformentioned range listed, please consider follow up with your Primary Care Provider.   You have been scheduled [facility] an abdominal [facility]1st floor of hospital) on 05/12/2019 at 9:00 am. Please arrive 15 minutes prior to your appointment for registration. Make certain not to have anything to eat or drink 6 hours prior to your appointment. Should you need to reschedule your appointment, please contact radiology at [phone]. This test typically takes about 30 minutes to perform.  We have sent the following medications to your pharmacy for you to pick up at your convenience:  Omeprazole. Clenpiq  Your provider has requested that you have lab work today. We ask that you go to our [facility] office at [address] [place], [place] [address]. Please enter through the main entrance and go to the elevator.  Press "B" on the elevator. The lab is located at the first door on the left as you exit the elevator..  Thank you for choosing [facility] Noralyn Pick, CRNP

## 2019-05-06 NOTE — Progress Notes (Signed)
05/06/2019 Dakota Garcia [id number] [birth year]   HISTORY OF PRESENT ILLNESS: Kathyrn Drown. Goldman is a 49 year old male with a past medical history hypertriglyceridemia, gout, sleep apnea cpap S/P cervical spine fusion 2334 and umbilical hernia repair 3568.  He was referred to our office by Dr. Jena Gauss for further evaluation regarding normocytic anemia and to schedule a colonoscopy.  He reported having laboratory studies done 1 month ago which showed normal iron levels with anemia.  Reports having a remote history of GERD back in the 1990s which showed GERD.  He was treated for with ranitidine for a few years.  He reports having heartburn once or twice monthly for the past few years.  He takes Tums as needed.  He denies having any dysphagia.  He denies having any upper or lower abdominal pain.  He reports having intermittent constipation and sometimes he has loose stools.  He reports passing black tarry loose to soft stools once every 2 to 3 weeks for the past year.  He denies any associated Pepto-Bismol use.  He does not take any iron supplements.  He takes Indocin as needed during gout flares.  No other NSAID use.  He reported having a colonoscopy at the time of his EGD in the 1990s due to having rectal bleeding, he was found to have internal hemorrhoids.  No polyps.  His mother was diagnosed with colon cancer in her late 31s or early 71s.  Paternal grandfather was diagnosed with colon cancer in his 53s and a paternal uncle was diagnosed with colon cancer in his late 58s.  Maternal grandfather with history of alcoholic cirrhosis.  Labs 03/15/2019: WBC 6.1.  Hemoglobin 13.0.  Hematocrit 37.9.  Platelet 196.  Sodium 141.  Potassium 4.2.  Glucose 123.  BUN 16.  Creatinine 0.94.  AST 36.  ALT 58.  Total bili 0.4.  Alk phos 88.  Iron 108.  TIBC 395.  Iron saturation 27%.  Ferritin 549.  Folate 15.8.  Vitamin B12 405.  Labs 02/13/2017: Hemoglobin 12.6.  Hematocrit 36.1.  MCV 91.4.  Platelet  233.   Past Medical History:  Diagnosis Date  . Gout   . Hyperlipidemia    Past Surgical History:  Procedure Laterality Date  . CERVICAL FUSION    . HERNIA REPAIR     Family History: Father age 71. Mother diagnosed with colon cancer late 62's or early 8's. Paternal grandfather colon cancer age 5's. Paternal uncle with colon cancer late 33's. Brother 63 healthy. Sister 59 MS.   Social History: Married. Tool and Dye maker. Quit smoking 10 years ago.  He dips smokeless tobacco. He drinks 2 beers on the weekends, sometimes 1 or 2 beers during the week. No drug use No coffee.     Current Outpatient Medications on File Prior to Visit  Medication Sig Dispense Refill  . allopurinol (ZYLOPRIM) 300 MG tablet TAKE 1 TABLET (300 MG TOTAL) BY MOUTH 2 (TWO) TIMES DAILY. 180 tablet 1  . fenofibrate 160 MG tablet Take 1 tablet (160 mg total) by mouth daily. 90 tablet 1  . sertraline (ZOLOFT) 100 MG tablet Take 1 tablet (100 mg total) by mouth daily. 90 tablet 0  . colchicine 0.6 MG tablet Take 1 tablet (0.6 mg total) by mouth 2 (two) times daily for 7 days. 14 tablet 3  . indomethacin (INDOCIN) 50 MG capsule Take 1 capsule (50 mg total) by mouth 2 (two) times daily with a meal for 7 days. 14 capsule 3  No current facility-administered medications on file prior to visit.    Allergies  Allergen Reactions  . Penicillins     REACTION: Doesn't know the reaction, but has taken Keflex and Ceftin with no reaction      Outpatient Encounter Medications as of 05/06/2019  Medication Sig  . allopurinol (ZYLOPRIM) 300 MG tablet TAKE 1 TABLET (300 MG TOTAL) BY MOUTH 2 (TWO) TIMES DAILY.  . fenofibrate 160 MG tablet Take 1 tablet (160 mg total) by mouth daily.  . sertraline (ZOLOFT) 100 MG tablet Take 1 tablet (100 mg total) by mouth daily.  . colchicine 0.6 MG tablet Take 1 tablet (0.6 mg total) by mouth 2 (two) times daily for 7 days.  . indomethacin (INDOCIN) 50 MG capsule Take 1 capsule (50 mg total)  by mouth 2 (two) times daily with a meal for 7 days.   No facility-administered encounter medications on file as of 05/06/2019.     REVIEW OF SYSTEMS  : All other systems reviewed and negative except where noted in the History of Present Illness.   PHYSICAL EXAM: BP 134/82   Pulse 86   Temp (!) 97.4 F (36.3 C)   Ht _0  (1.727 m)   Wt 289 lb (131.1 kg)   BMI 43.94 kg/m  General: Well developed 49 year old male in no acute distress. Head: Normocephalic and atraumatic. Eyes:  Sclerae non-icteric, conjunctive pink. Ears: Normal auditory acuity. Mouth: Dentition intact.  Neck: Supple, no lymphadenopathy or thyromegaly.  Lungs: Few audible wheezes upper airway otherwise clear.  Heart: Regular rate and rhythm. No murmur, rub or gallop appreciated.  Abdomen: Obese abdomen, soft, nontender, non distended. No masses. No hepatosplenomegaly. Normoactive bowel sounds x 4 quadrants.  Rectal: Deferred.  Musculoskeletal: Symmetrical with no gross deformities. Skin: Warm and dry. No rash or lesions on visible extremities. Extremities: No edema. Neurological: Alert oriented x 4, no focal deficits.  Psychological:  Alert and cooperative. Normal mood and affect.  ASSESSMENT AND PLAN:  45. 49 year old male with normocytic anemia.  Normal iron levels.  Significant family history of colon cancer -EGD/colonoscopy benefits and risk discussed including risk with sedation, risk of bleeding, infection perforation -CBC and CMP -Heme slides -Further follow-up to be determined after EGD and colonoscopy completed   2.  GERD -Omeprazole 20 mg 1 p.o. daily -See plan in #1   3. Elevated LFTs  - Abdominal sonogram, assess for fatty liver/evidence of cirrhosis -Repeat LFTs, if his LFTs remain elevated he will require further hepatology evaluation -Discussed eating a healthy diet, weight loss as tolerated     CC:  Silverio Decamp,*

## 2019-05-10 ENCOUNTER — Ambulatory Visit (INDEPENDENT_AMBULATORY_CARE_PROVIDER_SITE_OTHER): Payer: BC Managed Care – PPO | Admitting: Sports Medicine

## 2019-05-10 ENCOUNTER — Encounter: Payer: Self-pay | Admitting: Sports Medicine

## 2019-05-10 ENCOUNTER — Other Ambulatory Visit: Payer: Self-pay

## 2019-05-10 DIAGNOSIS — F411 Generalized anxiety disorder: Secondary | ICD-10-CM | POA: Diagnosis not present

## 2019-05-10 DIAGNOSIS — K769 Liver disease, unspecified: Secondary | ICD-10-CM | POA: Diagnosis not present

## 2019-05-10 MED ORDER — SERTRALINE HCL 100 MG PO TABS: 150.0000 mg | ORAL_TABLET | Freq: Every day | ORAL | 3 refills | Status: DC

## 2019-05-10 NOTE — Progress Notes (Addendum)
    Procedures performed today:    None.  Independent interpretation of tests performed by another provider:   None.  Impression and Recommendations:    Generalized anxiety disorder Dakota Garcia continues to improve, his symptoms are almost gone on 100 mg of Zoloft. He still has some frustration with some of his coworkers, particularly the younger ones that tend to spend most of the time on the phone. He also tends to ruminate about these issues at night and this in turn decreases his sleep quality. He is using his CPAP machine appropriately. He continues to be somewhat resistant to behavioral therapy, I have again recommended this. Increasing Zoloft to 150 mg, we can do a virtual visit in 2 months, he was given a PHQ/GAD form and will have these numbers ready for me at the appointment date and time.  Parenchymal liver disease Transaminitis, ultrasound showed significant hepatocellular disease. Recommendations from radiology were MR of the abdomen with and without contrast. Ordering this now.    ___________________________________________ Dakota Garcia. Dianah Field, M.D., ABFM., CAQSM. Primary Care and [place] Instructor of [place] of [facility] of Medicine

## 2019-05-10 NOTE — Progress Notes (Signed)
Agree with the assessment and plan as outlined by Colleen Kennedy-Smith, NP.   

## 2019-05-10 NOTE — Assessment & Plan Note (Signed)
Dakota Garcia continues to improve, his symptoms are almost gone on 100 mg of Zoloft. He still has some frustration with some of his coworkers, particularly the younger ones that tend to spend most of the time on the phone. He also tends to ruminate about these issues at night and this in turn decreases his sleep quality. He is using his CPAP machine appropriately. He continues to be somewhat resistant to behavioral therapy, I have again recommended this. Increasing Zoloft to 150 mg, we can do a virtual visit in 2 months, he was given a PHQ/GAD form and will have these numbers ready for me at the appointment date and time.

## 2019-05-12 ENCOUNTER — Other Ambulatory Visit: Payer: Self-pay

## 2019-05-12 ENCOUNTER — Ambulatory Visit (HOSPITAL_BASED_OUTPATIENT_CLINIC_OR_DEPARTMENT_OTHER)
Admission: RE | Admit: 2019-05-12 | Discharge: 2019-05-12 | Disposition: A | Discharge status: Home / Self Care | Payer: BC Managed Care – PPO | Source: Ambulatory Visit | Attending: Nurse Practitioner | Admitting: Nurse Practitioner

## 2019-05-12 DIAGNOSIS — Z800 Family history of malignant neoplasm of digestive organs: Secondary | ICD-10-CM | POA: Insufficient documentation

## 2019-05-12 DIAGNOSIS — D508 Other iron deficiency anemias: Secondary | ICD-10-CM | POA: Diagnosis not present

## 2019-05-12 DIAGNOSIS — R7989 Other specified abnormal findings of blood chemistry: Secondary | ICD-10-CM | POA: Insufficient documentation

## 2019-05-12 DIAGNOSIS — K921 Melena: Secondary | ICD-10-CM | POA: Diagnosis not present

## 2019-05-12 DIAGNOSIS — K7689 Other specified diseases of liver: Secondary | ICD-10-CM | POA: Diagnosis not present

## 2019-05-20 ENCOUNTER — Telehealth: Payer: Self-pay | Admitting: Nurse Practitioner

## 2019-05-20 ENCOUNTER — Other Ambulatory Visit: Payer: Self-pay

## 2019-05-24 ENCOUNTER — Other Ambulatory Visit: Payer: Self-pay

## 2019-05-24 DIAGNOSIS — K769 Liver disease, unspecified: Secondary | ICD-10-CM

## 2019-05-24 DIAGNOSIS — R7989 Other specified abnormal findings of blood chemistry: Secondary | ICD-10-CM

## 2019-05-25 DIAGNOSIS — K769 Liver disease, unspecified: Secondary | ICD-10-CM | POA: Insufficient documentation

## 2019-05-25 NOTE — Assessment & Plan Note (Signed)
Transaminitis, ultrasound showed significant hepatocellular disease. Recommendations from radiology were MR of the abdomen with and without contrast. Ordering this now.

## 2019-05-25 NOTE — Addendum Note (Signed)
Addended by: Silverio Decamp on: 05/25/2019 11:10 AM   Modules accepted: Orders

## 2019-05-29 ENCOUNTER — Other Ambulatory Visit: Payer: Self-pay

## 2019-05-29 ENCOUNTER — Ambulatory Visit (HOSPITAL_BASED_OUTPATIENT_CLINIC_OR_DEPARTMENT_OTHER)
Admission: RE | Admit: 2019-05-29 | Discharge: 2019-05-29 | Disposition: A | Discharge status: Home / Self Care | Payer: BC Managed Care – PPO | Source: Ambulatory Visit | Attending: Nurse Practitioner | Admitting: Nurse Practitioner

## 2019-05-29 DIAGNOSIS — R7989 Other specified abnormal findings of blood chemistry: Secondary | ICD-10-CM | POA: Insufficient documentation

## 2019-05-29 DIAGNOSIS — K769 Liver disease, unspecified: Secondary | ICD-10-CM | POA: Diagnosis not present

## 2019-05-29 DIAGNOSIS — K760 Fatty (change of) liver, not elsewhere classified: Secondary | ICD-10-CM | POA: Diagnosis not present

## 2019-05-29 MED ORDER — GADOBUTROL 1 MMOL/ML IV SOLN
10.0000 mL | Freq: Once | INTRAVENOUS | Status: AC | PRN
  Administered 2019-05-29: 10 mL via INTRAVENOUS

## 2019-05-31 ENCOUNTER — Encounter: Payer: Self-pay | Admitting: Gastroenterology

## 2019-06-10 ENCOUNTER — Ambulatory Visit (INDEPENDENT_AMBULATORY_CARE_PROVIDER_SITE_OTHER): Payer: BC Managed Care – PPO

## 2019-06-10 ENCOUNTER — Other Ambulatory Visit: Payer: Self-pay | Admitting: Gastroenterology

## 2019-06-10 DIAGNOSIS — Z1159 Encounter for screening for other viral diseases: Secondary | ICD-10-CM

## 2019-06-10 LAB — SARS CORONAVIRUS 2 (TAT 6-24 HRS): SARS Coronavirus 2: NEGATIVE

## 2019-06-14 ENCOUNTER — Other Ambulatory Visit: Payer: Self-pay

## 2019-06-14 ENCOUNTER — Ambulatory Visit (AMBULATORY_SURGERY_CENTER): Payer: BC Managed Care – PPO | Admitting: Gastroenterology

## 2019-06-14 ENCOUNTER — Encounter: Payer: Self-pay | Admitting: Gastroenterology

## 2019-06-14 VITALS — BP 128/79 | HR 72 | Temp 97.5°F | Resp 17 | Ht 68.0 in | Wt 289.0 lb

## 2019-06-14 DIAGNOSIS — D123 Benign neoplasm of transverse colon: Secondary | ICD-10-CM

## 2019-06-14 DIAGNOSIS — K2990 Gastroduodenitis, unspecified, without bleeding: Secondary | ICD-10-CM | POA: Diagnosis not present

## 2019-06-14 DIAGNOSIS — K3189 Other diseases of stomach and duodenum: Secondary | ICD-10-CM | POA: Diagnosis not present

## 2019-06-14 DIAGNOSIS — D128 Benign neoplasm of rectum: Secondary | ICD-10-CM | POA: Diagnosis not present

## 2019-06-14 DIAGNOSIS — Z1211 Encounter for screening for malignant neoplasm of colon: Secondary | ICD-10-CM | POA: Diagnosis not present

## 2019-06-14 DIAGNOSIS — D125 Benign neoplasm of sigmoid colon: Secondary | ICD-10-CM | POA: Diagnosis not present

## 2019-06-14 DIAGNOSIS — Z800 Family history of malignant neoplasm of digestive organs: Secondary | ICD-10-CM

## 2019-06-14 DIAGNOSIS — D649 Anemia, unspecified: Secondary | ICD-10-CM

## 2019-06-14 DIAGNOSIS — K921 Melena: Secondary | ICD-10-CM | POA: Diagnosis not present

## 2019-06-14 DIAGNOSIS — D127 Benign neoplasm of rectosigmoid junction: Secondary | ICD-10-CM | POA: Diagnosis not present

## 2019-06-14 DIAGNOSIS — K2970 Gastritis, unspecified, without bleeding: Secondary | ICD-10-CM

## 2019-06-14 MED ORDER — SODIUM CHLORIDE 0.9 % IV SOLN: 500.0000 mL | Freq: Once | INTRAVENOUS | Status: DC

## 2019-06-14 NOTE — Op Note (Signed)
[place] Patient Name: Dakota Garcia Procedure Date: 06/14/2019 7:17 AM MRN: [id number] Endoscopist: Gerrit Heck , MD Age: 49 Referring MD:  Date of Birth: 1970/06/04 Gender: Male Account #: [id number] Procedure:                Colonoscopy Indications:              Screening in patient at increased risk: Colorectal                            cancer in mother before age 73 (diagnosed in her                            26's/50's) along with paternal grandfather with CRC                            in his 59's and paternal uncle with CRC in his 29's. Medicines:                Monitored Anesthesia Care Procedure:                Pre-Anesthesia Assessment:                           - Prior to the procedure, a History and Physical                            was performed, and patient medications and                            allergies were reviewed. The patient's tolerance of                            previous anesthesia was also reviewed. The risks                            and benefits of the procedure and the sedation                            options and risks were discussed with the patient.                            All questions were answered, and informed consent                            was obtained. Prior Anticoagulants: The patient has                            taken no previous anticoagulant or antiplatelet                            agents. ASA Grade Assessment: III - A patient with                            severe systemic disease. After reviewing the risks  and benefits, the patient was deemed in                            satisfactory condition to undergo the procedure.                           After obtaining informed consent, the colonoscope                            was passed under direct vision. Throughout the                            procedure, the patient's blood pressure, pulse, and   oxygen saturations were monitored continuously. The                            Colonoscope was introduced through the anus and                            advanced to the the terminal ileum. The colonoscopy                            was performed without difficulty. The patient                            tolerated the procedure well. The quality of the                            bowel preparation was adequate. The terminal ileum,                            ileocecal valve, appendiceal orifice, and rectum                            were photographed. Scope In: 8:13:13 AM Scope Out: 8:30:40 AM Scope Withdrawal Time: 0 hours 16 minutes 35 seconds  Total Procedure Duration: 0 hours 17 minutes 27 seconds  Findings:                 The perianal and digital rectal examinations were                            normal.                           Four sessile polyps were found in the sigmoid colon                            (1) and transverse colon (3). The polyps were 4 to                            6 mm in size. These polyps were removed with a cold                            snare. Resection  and retrieval were complete.                            Estimated blood loss was minimal.                           A 3 mm polyp was found in the rectum. The polyp was                            sessile. The polyp was removed with a cold snare.                            Resection and retrieval were complete. Estimated                            blood loss was minimal.                           The exam was otherwise normal throughout the                            remainder of the colon.                           The retroflexed view of the distal rectum and anal                            verge was normal and showed no anal or rectal                            abnormalities.                           The terminal ileum appeared normal. Complications:            No immediate complications. Estimated Blood  Loss:     Estimated blood loss was minimal. Impression:               - Four 4 to 6 mm polyps in the sigmoid colon and in                            the transverse colon, removed with a cold snare.                            Resected and retrieved.                           - One 3 mm polyp in the rectum, removed with a cold                            snare. Resected and retrieved.                           - The distal rectum and anal verge are normal on  retroflexion view.                           - The examined portion of the ileum was normal. Recommendation:           - Patient has a contact number available for                            emergencies. The signs and symptoms of potential                            delayed complications were discussed with the                            patient. Return to normal activities tomorrow.                            Written discharge instructions were provided to the                            patient.                           - Resume previous diet.                           - Continue present medications.                           - Await pathology results.                           - Repeat colonoscopy in 3 years for surveillance.                           - Return to GI office PRN. Gerrit Heck, MD 06/14/2019 8:41:00 AM

## 2019-06-14 NOTE — Op Note (Signed)
[place] Patient Name: Dakota Garcia Procedure Date: 06/14/2019 7:20 AM MRN: [id number] Endoscopist: Gerrit Heck , MD Age: 49 Referring MD:  Date of Birth: Sep 01, 1970 Gender: Male Account #: [id number] Procedure:                Upper GI endoscopy Indications:              History of intermittent heartburn. Not currently                            taking any acid suppression therapy. Also with                            recent history of dark stools (Melena) with mild                            normocytic anemia with normal iron indices. Medicines:                Monitored Anesthesia Care Procedure:                Pre-Anesthesia Assessment:                           - Prior to the procedure, a History and Physical                            was performed, and patient medications and                            allergies were reviewed. The patient's tolerance of                            previous anesthesia was also reviewed. The risks                            and benefits of the procedure and the sedation                            options and risks were discussed with the patient.                            All questions were answered, and informed consent                            was obtained. Prior Anticoagulants: The patient has                            taken no previous anticoagulant or antiplatelet                            agents. ASA Grade Assessment: III - A patient with                            severe systemic disease. After reviewing the risks  and benefits, the patient was deemed in                            satisfactory condition to undergo the procedure.                           After obtaining informed consent, the endoscope was                            passed under direct vision. Throughout the                            procedure, the patient's blood pressure, pulse, and                            oxygen  saturations were monitored continuously. The                            Endoscope was introduced through the mouth, and                            advanced to the second part of duodenum. The upper                            GI endoscopy was accomplished without difficulty.                            The patient tolerated the procedure well. Scope In: Scope Out: Findings:                 The examined esophagus was normal.                           The Z-line was regular and was found 43 cm from the                            incisors.                           Scattered mild inflammation characterized by                            erythema was found in the gastric body and in the                            gastric antrum. Biopsies were taken with a cold                            forceps for Helicobacter pylori testing. Estimated                            blood loss was minimal.                           The duodenal bulb, first portion of  the duodenum                            and second portion of the duodenum were normal. Complications:            No immediate complications. Estimated Blood Loss:     Estimated blood loss was minimal. Impression:               - Normal esophagus.                           - Z-line regular, 43 cm from the incisors.                           - Gastritis. Biopsied.                           - Normal duodenal bulb, first portion of the                            duodenum and second portion of the duodenum. Recommendation:           - Patient has a contact number available for                            emergencies. The signs and symptoms of potential                            delayed complications were discussed with the                            patient. Return to normal activities tomorrow.                            Written discharge instructions were provided to the                            patient.                           - Resume previous  diet.                           - Continue present medications.                           - Await pathology results.                           - Perform a colonoscopy today. Gerrit Heck, MD 06/14/2019 8:37:06 AM

## 2019-06-14 NOTE — Patient Instructions (Signed)
Thank you for letting us take care of your healthcare needs today. Please see handout given to you on Polyps.    YOU HAD AN ENDOSCOPIC PROCEDURE TODAY AT [facility]:   Refer to the procedure report that was given to you for any specific questions about what was found during the examination.  If the procedure report does not answer your questions, please call your gastroenterologist to clarify.  If you requested that your care partner not be given the details of your procedure findings, then the procedure report has been included in a sealed envelope for you to review at your convenience later.  YOU SHOULD EXPECT: Some feelings of bloating in the abdomen. Passage of more gas than usual.  Walking can help get rid of the air that was put into your GI tract during the procedure and reduce the bloating. If you had a lower endoscopy (such as a colonoscopy or flexible sigmoidoscopy) you may notice spotting of blood in your stool or on the toilet paper. If you underwent a bowel prep for your procedure, you may not have a normal bowel movement for a few days.  Please Note:  You might notice some irritation and congestion in your nose or some drainage.  This is from the oxygen used during your procedure.  There is no need for concern and it should clear up in a day or so.  SYMPTOMS TO REPORT IMMEDIATELY:   Following lower endoscopy (colonoscopy or flexible sigmoidoscopy):  Excessive amounts of blood in the stool  Significant tenderness or worsening of abdominal pains  Swelling of the abdomen that is new, acute  Fever of 100F or higher   Following upper endoscopy (EGD)  Vomiting of blood or coffee ground material  New chest pain or pain under the shoulder blades  Painful or persistently difficult swallowing  New shortness of breath  Fever of 100F or higher  Black, tarry-looking stools  For urgent or emergent issues, a gastroenterologist can be reached at any hour by calling  [phone]. Do not use MyChart messaging for urgent concerns.    DIET:  We do recommend a small meal at first, but then you may proceed to your regular diet.  Drink plenty of fluids but you should avoid alcoholic beverages for 24 hours.  ACTIVITY:  You should plan to take it easy for the rest of today and you should NOT DRIVE or use heavy machinery until tomorrow (because of the sedation medicines used during the test).    FOLLOW UP: Our staff will call the number listed on your records 48-72 hours following your procedure to check on you and address any questions or concerns that you may have regarding the information given to you following your procedure. If we do not reach you, we will leave a message.  We will attempt to reach you two times.  During this call, we will ask if you have developed any symptoms of COVID 19. If you develop any symptoms (ie: fever, flu-like symptoms, shortness of breath, cough etc.) before then, please call [phone].  If you test positive for Covid 19 in the 2 weeks post procedure, please call and report this information to [place].    If any biopsies were taken you will be contacted by phone or by letter within the next 1-3 weeks.  Please call us at [phone] if you have not heard about the biopsies in 3 weeks.    SIGNATURES/CONFIDENTIALITY: You and/or your care partner have signed  paperwork which will be entered into your electronic medical record.  These signatures attest to the fact that that the information above on your After Visit Summary has been reviewed and is understood.  Full responsibility of the confidentiality of this discharge information lies with you and/or your care-partner.

## 2019-06-14 NOTE — Progress Notes (Signed)
Called to room to assist during endoscopic procedure.  Patient ID and intended procedure confirmed with present staff. Received instructions for my participation in the procedure from the performing physician.  

## 2019-06-14 NOTE — Progress Notes (Signed)
pt tolerated well. VSS. awake and to recovery. Report given to RN.  

## 2019-06-14 NOTE — Progress Notes (Signed)
Temp by JB Vitals by DT 

## 2019-06-16 ENCOUNTER — Telehealth: Payer: Self-pay

## 2019-06-16 NOTE — Telephone Encounter (Signed)
No answer, left message to call if having any issues or concerns, B.Malyah Ohlrich RN 

## 2019-06-16 NOTE — Telephone Encounter (Signed)
Left message on answering machine. 

## 2019-06-25 ENCOUNTER — Encounter: Payer: Self-pay | Admitting: Gastroenterology

## 2019-07-12 ENCOUNTER — Telehealth: Payer: BC Managed Care – PPO | Admitting: Sports Medicine

## 2019-07-24 ENCOUNTER — Other Ambulatory Visit: Payer: Self-pay | Admitting: Sports Medicine

## 2019-07-24 DIAGNOSIS — F411 Generalized anxiety disorder: Secondary | ICD-10-CM

## 2019-09-13 ENCOUNTER — Ambulatory Visit (INDEPENDENT_AMBULATORY_CARE_PROVIDER_SITE_OTHER): Payer: BC Managed Care – PPO | Admitting: Sports Medicine

## 2019-09-13 ENCOUNTER — Other Ambulatory Visit: Payer: Self-pay

## 2019-09-13 DIAGNOSIS — S8992XD Unspecified injury of left lower leg, subsequent encounter: Secondary | ICD-10-CM | POA: Diagnosis not present

## 2019-09-13 NOTE — Assessment & Plan Note (Signed)
Dakota Garcia is a very pleasant 49 year old male, he has a long history of right knee pain, worsening recently. He did have a fall, ultimately we obtained an MRI that showed a small undersurface tear of the medial meniscus, he had an injection and did well for 3 to 4 months. He is now having a recurrence of pain, I reinjected his knee, he would like a second opinion from orthopedic surgery. He does have a bit of arthritis in the knee, has some moderate gelling. I did explain to him that in the absence of mechanical symptoms it was probably best to continue nonoperative management but it would be acceptable to get a surgical opinion to see if the undersurface tear could be fixed surgically so that he would not need any more injections. I also gave him some information on viscosupplementation today.

## 2019-09-13 NOTE — Progress Notes (Signed)
    Procedures performed today:    Procedure: Real-time Ultrasound Guided injection of the left knee Device: Samsung HS60  Verbal informed consent obtained.  Time-out conducted.  Noted no overlying erythema, induration, or other signs of local infection.  Skin prepped in a sterile fashion.  Local anesthesia: Topical Ethyl chloride.  With sterile technique and under real time ultrasound guidance: 1 cc Kenalog 40, 2 cc lidocaine, 2 cc bupivacaine injected easily Completed without difficulty  Pain immediately resolved suggesting accurate placement of the medication.  Advised to call if fevers/chills, erythema, induration, drainage, or persistent bleeding.  Images permanently stored and available for review in the ultrasound unit.  Impression: Technically successful ultrasound guided injection.  Independent interpretation of notes and tests performed by another provider:   None.  Brief History, Exam, Impression, and Recommendations:    Left knee injury Dakota Garcia is a very pleasant 49 year old male, he has a long history of right knee pain, worsening recently. He did have a fall, ultimately we obtained an MRI that showed a small undersurface tear of the medial meniscus, he had an injection and did well for 3 to 4 months. He is now having a recurrence of pain, I reinjected his knee, he would like a second opinion from orthopedic surgery. He does have a bit of arthritis in the knee, has some moderate gelling. I did explain to him that in the absence of mechanical symptoms it was probably best to continue nonoperative management but it would be acceptable to get a surgical opinion to see if the undersurface tear could be fixed surgically so that he would not need any more injections. I also gave him some information on viscosupplementation today.    ___________________________________________ Gwen Her. Dianah Field, M.D., ABFM., CAQSM. Primary Care and [place] Instructor of [place] of [facility] of [facility]

## 2019-09-20 DIAGNOSIS — Z6841 Body Mass Index (BMI) 40.0 and over, adult: Secondary | ICD-10-CM | POA: Diagnosis not present

## 2019-09-20 DIAGNOSIS — M25562 Pain in left knee: Secondary | ICD-10-CM | POA: Diagnosis not present

## 2019-10-06 DIAGNOSIS — Y999 Unspecified external cause status: Secondary | ICD-10-CM | POA: Diagnosis not present

## 2019-10-06 DIAGNOSIS — S83242A Other tear of medial meniscus, current injury, left knee, initial encounter: Secondary | ICD-10-CM | POA: Diagnosis not present

## 2019-10-06 DIAGNOSIS — X58XXXA Exposure to other specified factors, initial encounter: Secondary | ICD-10-CM | POA: Diagnosis not present

## 2019-10-06 DIAGNOSIS — S83232A Complex tear of medial meniscus, current injury, left knee, initial encounter: Secondary | ICD-10-CM | POA: Diagnosis not present

## 2019-10-06 DIAGNOSIS — S83272A Complex tear of lateral meniscus, current injury, left knee, initial encounter: Secondary | ICD-10-CM | POA: Diagnosis not present

## 2019-10-06 DIAGNOSIS — S83282A Other tear of lateral meniscus, current injury, left knee, initial encounter: Secondary | ICD-10-CM | POA: Diagnosis not present

## 2019-10-06 DIAGNOSIS — G8918 Other acute postprocedural pain: Secondary | ICD-10-CM | POA: Diagnosis not present

## 2019-10-12 ENCOUNTER — Ambulatory Visit (INDEPENDENT_AMBULATORY_CARE_PROVIDER_SITE_OTHER): Payer: BC Managed Care – PPO | Admitting: Sports Medicine

## 2019-10-12 ENCOUNTER — Ambulatory Visit (HOSPITAL_BASED_OUTPATIENT_CLINIC_OR_DEPARTMENT_OTHER)
Admission: RE | Admit: 2019-10-12 | Discharge: 2019-10-12 | Disposition: A | Discharge status: Home / Self Care | Payer: BC Managed Care – PPO | Source: Ambulatory Visit | Attending: Sports Medicine | Admitting: Sports Medicine

## 2019-10-12 ENCOUNTER — Other Ambulatory Visit: Payer: Self-pay

## 2019-10-12 DIAGNOSIS — S8992XD Unspecified injury of left lower leg, subsequent encounter: Secondary | ICD-10-CM

## 2019-10-12 DIAGNOSIS — M25562 Pain in left knee: Secondary | ICD-10-CM | POA: Diagnosis not present

## 2019-10-12 DIAGNOSIS — S8992XA Unspecified injury of left lower leg, initial encounter: Secondary | ICD-10-CM | POA: Diagnosis not present

## 2019-10-12 DIAGNOSIS — M25662 Stiffness of left knee, not elsewhere classified: Secondary | ICD-10-CM | POA: Diagnosis not present

## 2019-10-12 NOTE — Assessment & Plan Note (Signed)
Dakota Garcia returns, he had a long history of knee pain, ultimately we obtained an MRI that showed a small undersurface tear of the medial meniscus, I injected his knee back on the seventh of last month, we did get a second opinion from orthopedic surgery due to some mechanical symptoms, he did have an arthroscopy recently, unfortunately is now having significant calf pain on the left, positive Homans' sign, he also has a significant joint effusion, the effusion was drained, and we are going to get a stat DVT ultrasound at [facility].

## 2019-10-12 NOTE — Progress Notes (Signed)
    Procedures performed today:    Procedure: Real-time Ultrasound Guided aspiration of left knee Device: Samsung HS60  Verbal informed consent obtained.  Time-out conducted.  Noted no overlying erythema, induration, or other signs of local infection.  Skin prepped in a sterile fashion.  Local anesthesia: Topical Ethyl chloride.  With sterile technique and under real time ultrasound guidance:  Using an 18-gauge needle I aspirated 49 cc of serosanguineous fluid. Completed without difficulty  Pain immediately resolved suggesting accurate placement of the medication.  Advised to call if fevers/chills, erythema, induration, drainage, or persistent bleeding.  Images permanently stored and available for review in the ultrasound unit.  Impression: Technically successful ultrasound guided injection.  Independent interpretation of notes and tests performed by another provider:   None.  Brief History, Exam, Impression, and Recommendations:    Left knee injury Dakota Garcia returns, he had a long history of knee pain, ultimately we obtained an MRI that showed a small undersurface tear of the medial meniscus, I injected his knee back on the seventh of last month, we did get a second opinion from orthopedic surgery due to some mechanical symptoms, he did have an arthroscopy recently, unfortunately is now having significant calf pain on the left, positive Homans' sign, he also has a significant joint effusion, the effusion was drained, and we are going to get a stat DVT ultrasound at [facility].    ___________________________________________ Gwen Her. Dianah Field, M.D., ABFM., CAQSM. Primary Care and [place] Instructor of [place] of [facility] of Medicine

## 2019-10-19 DIAGNOSIS — M25562 Pain in left knee: Secondary | ICD-10-CM | POA: Diagnosis not present

## 2019-10-19 DIAGNOSIS — M25662 Stiffness of left knee, not elsewhere classified: Secondary | ICD-10-CM | POA: Diagnosis not present

## 2019-10-28 DIAGNOSIS — S83242D Other tear of medial meniscus, current injury, left knee, subsequent encounter: Secondary | ICD-10-CM | POA: Diagnosis not present

## 2019-10-28 DIAGNOSIS — S83282D Other tear of lateral meniscus, current injury, left knee, subsequent encounter: Secondary | ICD-10-CM | POA: Diagnosis not present

## 2019-11-04 ENCOUNTER — Other Ambulatory Visit: Payer: Self-pay

## 2019-11-04 ENCOUNTER — Encounter: Payer: Self-pay | Admitting: Sports Medicine

## 2019-11-04 ENCOUNTER — Ambulatory Visit (INDEPENDENT_AMBULATORY_CARE_PROVIDER_SITE_OTHER): Payer: BC Managed Care – PPO | Admitting: Sports Medicine

## 2019-11-04 DIAGNOSIS — Z20822 Contact with and (suspected) exposure to covid-19: Secondary | ICD-10-CM | POA: Diagnosis not present

## 2019-11-04 DIAGNOSIS — S8992XD Unspecified injury of left lower leg, subsequent encounter: Secondary | ICD-10-CM | POA: Diagnosis not present

## 2019-11-04 NOTE — Progress Notes (Signed)
    Procedures performed today:    Procedure: Real-time Ultrasound Guided aspiration/injection of left knee Device: Samsung HS60  Verbal informed consent obtained.  Time-out conducted.  Noted no overlying erythema, induration, or other signs of local infection.  Skin prepped in a sterile fashion.  Local anesthesia: Topical Ethyl chloride.  With sterile technique and under real time ultrasound guidance:  Aspirated 43 cc of clear, straw-colored fluid, syringe switched and 1 cc Kenalog 40, 2 cc lidocaine, 2 cc bupivacaine injected easily Completed without difficulty  Pain immediately resolved suggesting accurate placement of the medication.  Advised to call if fevers/chills, erythema, induration, drainage, or persistent bleeding.  Images permanently stored and available for review in the ultrasound unit.  Impression: Technically successful ultrasound guided injection.  Independent interpretation of notes and tests performed by another provider:   None.  Brief History, Exam, Impression, and Recommendations:    Left knee injury Dakota Garcia returns, he had a knee arthroscopy, had some swelling afterwards so we did an aspiration of the last visit, not having a recurrence of swelling, repeat aspiration but with injection this time, he understands the further management of his postoperative knee needs to be with his surgeon. Of note he had a positive Homans' sign and calf pain, DVT ultrasound was negative, return as needed.    ___________________________________________ Gwen Her. Dianah Field, M.D., ABFM., CAQSM. Primary Care and [place] Instructor of [place] of [facility] of Medicine

## 2019-11-04 NOTE — Assessment & Plan Note (Signed)
Marks returns, he had a knee arthroscopy, had some swelling afterwards so we did an aspiration of the last visit, not having a recurrence of swelling, repeat aspiration but with injection this time, he understands the further management of his postoperative knee needs to be with his surgeon. Of note he had a positive Homans' sign and calf pain, DVT ultrasound was negative, return as needed.

## 2019-11-05 LAB — SARS COV-2 SEROLOGY(COVID-19)AB(IGG,IGM),IMMUNOASSAY
SARS CoV-2 AB IgG: NEGATIVE
SARS CoV-2 IgM: NEGATIVE

## 2019-11-22 DIAGNOSIS — M25462 Effusion, left knee: Secondary | ICD-10-CM | POA: Diagnosis not present

## 2019-11-22 DIAGNOSIS — M25562 Pain in left knee: Secondary | ICD-10-CM | POA: Diagnosis not present

## 2019-11-22 DIAGNOSIS — S83242D Other tear of medial meniscus, current injury, left knee, subsequent encounter: Secondary | ICD-10-CM | POA: Diagnosis not present

## 2019-11-22 DIAGNOSIS — S83282D Other tear of lateral meniscus, current injury, left knee, subsequent encounter: Secondary | ICD-10-CM | POA: Diagnosis not present

## 2019-11-23 ENCOUNTER — Ambulatory Visit (HOSPITAL_COMMUNITY)
Admission: RE | Admit: 2019-11-23 | Discharge: 2019-11-23 | Disposition: A | Discharge status: Home / Self Care | Payer: BC Managed Care – PPO | Source: Ambulatory Visit | Attending: Sports Medicine | Admitting: Sports Medicine

## 2019-11-23 DIAGNOSIS — S8992XD Unspecified injury of left lower leg, subsequent encounter: Secondary | ICD-10-CM | POA: Insufficient documentation

## 2019-12-31 ENCOUNTER — Other Ambulatory Visit: Payer: Self-pay | Admitting: Sports Medicine

## 2019-12-31 DIAGNOSIS — E783 Hyperchylomicronemia: Secondary | ICD-10-CM

## 2020-01-03 DIAGNOSIS — Z1159 Encounter for screening for other viral diseases: Secondary | ICD-10-CM | POA: Diagnosis not present

## 2020-01-03 DIAGNOSIS — S83242D Other tear of medial meniscus, current injury, left knee, subsequent encounter: Secondary | ICD-10-CM | POA: Diagnosis not present

## 2020-01-03 DIAGNOSIS — M25562 Pain in left knee: Secondary | ICD-10-CM | POA: Diagnosis not present

## 2020-01-03 DIAGNOSIS — M25462 Effusion, left knee: Secondary | ICD-10-CM | POA: Diagnosis not present

## 2020-01-24 DIAGNOSIS — M25562 Pain in left knee: Secondary | ICD-10-CM | POA: Diagnosis not present

## 2020-01-27 DIAGNOSIS — M25562 Pain in left knee: Secondary | ICD-10-CM | POA: Diagnosis not present

## 2020-01-31 DIAGNOSIS — M25562 Pain in left knee: Secondary | ICD-10-CM | POA: Diagnosis not present

## 2020-08-09 ENCOUNTER — Ambulatory Visit (INDEPENDENT_AMBULATORY_CARE_PROVIDER_SITE_OTHER): Payer: BC Managed Care – PPO

## 2020-08-09 ENCOUNTER — Encounter: Payer: Self-pay | Admitting: Sports Medicine

## 2020-08-09 ENCOUNTER — Other Ambulatory Visit: Payer: Self-pay

## 2020-08-09 ENCOUNTER — Ambulatory Visit (INDEPENDENT_AMBULATORY_CARE_PROVIDER_SITE_OTHER): Payer: BC Managed Care – PPO | Admitting: Sports Medicine

## 2020-08-09 DIAGNOSIS — M170 Bilateral primary osteoarthritis of knee: Secondary | ICD-10-CM | POA: Diagnosis not present

## 2020-08-09 DIAGNOSIS — M1711 Unilateral primary osteoarthritis, right knee: Secondary | ICD-10-CM | POA: Diagnosis not present

## 2020-08-09 DIAGNOSIS — E783 Hyperchylomicronemia: Secondary | ICD-10-CM | POA: Diagnosis not present

## 2020-08-09 DIAGNOSIS — F411 Generalized anxiety disorder: Secondary | ICD-10-CM

## 2020-08-09 DIAGNOSIS — M10072 Idiopathic gout, left ankle and foot: Secondary | ICD-10-CM

## 2020-08-09 DIAGNOSIS — M1712 Unilateral primary osteoarthritis, left knee: Secondary | ICD-10-CM | POA: Diagnosis not present

## 2020-08-09 DIAGNOSIS — Z0000 Encounter for general adult medical examination without abnormal findings: Secondary | ICD-10-CM

## 2020-08-09 DIAGNOSIS — E119 Type 2 diabetes mellitus without complications: Secondary | ICD-10-CM

## 2020-08-09 MED ORDER — ALLOPURINOL 300 MG PO TABS: 300.0000 mg | ORAL_TABLET | Freq: Every day | ORAL | 3 refills | Status: DC

## 2020-08-09 MED ORDER — SERTRALINE HCL 100 MG PO TABS: 150.0000 mg | ORAL_TABLET | Freq: Every day | ORAL | 3 refills | Status: DC

## 2020-08-09 MED ORDER — FENOFIBRATE 160 MG PO TABS: 160.0000 mg | ORAL_TABLET | Freq: Every day | ORAL | 3 refills | Status: DC

## 2020-08-09 NOTE — Assessment & Plan Note (Signed)
Recurrence of left knee swelling, aspiration and injection today. Last performed a year ago. Getting updated x-rays. Return in a month.

## 2020-08-09 NOTE — Progress Notes (Addendum)
    Procedures performed today:    Procedure: Real-time Ultrasound Guided aspiration/injection of left knee Device: Samsung HS60  Verbal informed consent obtained.  Time-out conducted.  Noted no overlying erythema, induration, or other signs of local infection.  Skin prepped in a sterile fashion.  Local anesthesia: Topical Ethyl chloride.  With sterile technique and under real time ultrasound guidance:  Advanced into the suprapatellar recess, noted effusion, aspirated 45 mL of clear, straw-colored fluid, syringe switched and 1 cc Kenalog 40, 2 cc lidocaine, 2 cc bupivacaine injected easily Completed without difficulty  Advised to call if fevers/chills, erythema, induration, drainage, or persistent bleeding.  Images permanently stored and available for review in PACS.  Impression: Technically successful ultrasound guided injection.  Procedure: Real-time Ultrasound Guided injection of the right knee Device: Samsung HS60  Verbal informed consent obtained.  Time-out conducted.  Noted no overlying erythema, induration, or other signs of local infection.  Skin prepped in a sterile fashion.  Local anesthesia: Topical Ethyl chloride.  With sterile technique and under real time ultrasound guidance:  Noted trace effusion, 1 cc Kenalog 40, 2 cc lidocaine, 2 cc bupivacaine injected easily Completed without difficulty  Advised to call if fevers/chills, erythema, induration, drainage, or persistent bleeding.  Images permanently stored and available for review in PACS.  Impression: Technically successful ultrasound guided injection.  Independent interpretation of notes and tests performed by another provider:   None.  Brief History, Exam, Impression, and Recommendations:    Primary osteoarthritis of both knees Recurrence of left knee swelling, aspiration and injection today. Last performed a year ago. Getting updated x-rays. Return in a month.  Annual physical exam Routine labs ordered,  he will return in a month for annual physical exam.  We can catch him up on vaccinations at that time.  Hyperlipidemia Lipids are fairly severely elevated.  I suspect he is not taking his fenofibrate. Considering new diagnosis of diabetes will probably need to add something else as well, has tried multiple statins without much improvement, we may need to use Repatha.  Controlled type 2 diabetes mellitus without complication, without long-term current use of insulin ([place]) We will get him back in to discuss diabetes and further screenings that are needed.  Gout attack Uric acid is moderately elevated, I suspect he has been missing doses of his allopurinol.    ___________________________________________ Gwen Her. Dianah Field, M.D., ABFM., CAQSM. Primary Care and [place] Instructor of [place] of [facility] of Medicine

## 2020-08-09 NOTE — Assessment & Plan Note (Signed)
Routine labs ordered, he will return in a month for annual physical exam.  We can catch him up on vaccinations at that time.

## 2020-08-10 DIAGNOSIS — E119 Type 2 diabetes mellitus without complications: Secondary | ICD-10-CM | POA: Insufficient documentation

## 2020-08-10 LAB — COMPREHENSIVE METABOLIC PANEL
AG Ratio: 1.5 (calc) (ref 1.0–2.5)
ALT: 89 U/L — ABNORMAL HIGH (ref 9–46)
AST: 83 U/L — ABNORMAL HIGH (ref 10–35)
Albumin: 4.5 g/dL (ref 3.6–5.1)
Alkaline phosphatase (APISO): 86 U/L (ref 35–144)
BUN: 11 mg/dL (ref 7–25)
CO2: 23 mmol/L (ref 20–32)
Calcium: 9.5 mg/dL (ref 8.6–10.3)
Chloride: 103 mmol/L (ref 98–110)
Creat: 0.78 mg/dL (ref 0.70–1.33)
Globulin: 3 g/dL (calc) (ref 1.9–3.7)
Glucose, Bld: 153 mg/dL — ABNORMAL HIGH (ref 65–99)
Potassium: 4.2 mmol/L (ref 3.5–5.3)
Sodium: 140 mmol/L (ref 135–146)
Total Bilirubin: 0.4 mg/dL (ref 0.2–1.2)
Total Protein: 7.5 g/dL (ref 6.1–8.1)

## 2020-08-10 LAB — CBC
HCT: 40.2 % (ref 38.5–50.0)
Hemoglobin: 13.8 g/dL (ref 13.2–17.1)
MCH: 32.2 pg (ref 27.0–33.0)
MCHC: 34.3 g/dL (ref 32.0–36.0)
MCV: 93.9 fL (ref 80.0–100.0)
MPV: 10.5 fL (ref 7.5–12.5)
Platelets: 206 10*3/uL (ref 140–400)
RBC: 4.28 10*6/uL (ref 4.20–5.80)
RDW: 12.4 % (ref 11.0–15.0)
WBC: 5.8 10*3/uL (ref 3.8–10.8)

## 2020-08-10 LAB — TSH: TSH: 2.46 mIU/L (ref 0.40–4.50)

## 2020-08-10 LAB — LIPID PANEL
Cholesterol: 215 mg/dL — ABNORMAL HIGH (ref <200)
HDL: 27 mg/dL — ABNORMAL LOW (ref >=40)
Non-HDL Cholesterol (Calc): 188 mg/dL (calc) — ABNORMAL HIGH (ref <130)
Total CHOL/HDL Ratio: 8 (calc) — ABNORMAL HIGH (ref <5.0)
Triglycerides: 789 mg/dL — ABNORMAL HIGH (ref <150)

## 2020-08-10 LAB — HEMOGLOBIN A1C
Hgb A1c MFr Bld: 6.9 % of total Hgb — ABNORMAL HIGH (ref <5.7)
Mean Plasma Glucose: 151 mg/dL
eAG (mmol/L): 8.4 mmol/L

## 2020-08-10 LAB — URIC ACID: Uric Acid, Serum: 8.2 mg/dL — ABNORMAL HIGH (ref 4.0–8.0)

## 2020-08-10 NOTE — Assessment & Plan Note (Signed)
We will get him back in to discuss diabetes and further screenings that are needed.

## 2020-08-10 NOTE — Assessment & Plan Note (Signed)
Uric acid is moderately elevated, I suspect he has been missing doses of his allopurinol.

## 2020-08-10 NOTE — Assessment & Plan Note (Signed)
Lipids are fairly severely elevated.  I suspect he is not taking his fenofibrate. Considering new diagnosis of diabetes will probably need to add something else as well, has tried multiple statins without much improvement, we may need to use Repatha.

## 2020-08-17 ENCOUNTER — Other Ambulatory Visit: Payer: Self-pay

## 2020-08-17 ENCOUNTER — Ambulatory Visit (INDEPENDENT_AMBULATORY_CARE_PROVIDER_SITE_OTHER): Payer: BC Managed Care – PPO | Admitting: Sports Medicine

## 2020-08-17 ENCOUNTER — Encounter: Payer: Self-pay | Admitting: Sports Medicine

## 2020-08-17 VITALS — BP 118/75 | HR 84 | Ht 68.0 in | Wt 280.0 lb

## 2020-08-17 DIAGNOSIS — E119 Type 2 diabetes mellitus without complications: Secondary | ICD-10-CM | POA: Diagnosis not present

## 2020-08-17 DIAGNOSIS — S90454A Superficial foreign body, right lesser toe(s), initial encounter: Secondary | ICD-10-CM

## 2020-08-17 DIAGNOSIS — E783 Hyperchylomicronemia: Secondary | ICD-10-CM | POA: Diagnosis not present

## 2020-08-17 DIAGNOSIS — M10072 Idiopathic gout, left ankle and foot: Secondary | ICD-10-CM | POA: Diagnosis not present

## 2020-08-17 DIAGNOSIS — Z23 Encounter for immunization: Secondary | ICD-10-CM

## 2020-08-17 DIAGNOSIS — M170 Bilateral primary osteoarthritis of knee: Secondary | ICD-10-CM | POA: Diagnosis not present

## 2020-08-17 DIAGNOSIS — S90456A Superficial foreign body, unspecified lesser toe(s), initial encounter: Secondary | ICD-10-CM | POA: Insufficient documentation

## 2020-08-17 LAB — POCT UA - MICROALBUMIN
Albumin/Creatinine Ratio, Urine, POC: <30
Creatinine, POC: 300 mg/dL
Microalbumin Ur, POC: 30 mg/L

## 2020-08-17 MED ORDER — OZEMPIC (0.25 OR 0.5 MG/DOSE) 2 MG/1.5ML ~~LOC~~ SOPN
PEN_INJECTOR | SUBCUTANEOUS | 1 refills | Status: DC
Start: 2020-08-17 — End: 2020-09-29

## 2020-08-17 MED ORDER — OZEMPIC (1 MG/DOSE) 2 MG/1.5ML ~~LOC~~ SOPN: 1.0000 mg | PEN_INJECTOR | SUBCUTANEOUS | 11 refills | Status: DC

## 2020-08-17 MED ORDER — ASPIRIN EC 81 MG PO TBEC: 81.0000 mg | DELAYED_RELEASE_TABLET | Freq: Every day | ORAL | 3 refills | Status: DC

## 2020-08-17 MED ORDER — METFORMIN HCL ER 500 MG PO TB24
500.0000 mg | ORAL_TABLET | Freq: Every day | ORAL | 11 refills | Status: DC
Start: 2020-08-17 — End: 2020-09-29

## 2020-08-17 NOTE — Progress Notes (Signed)
    Procedures performed today:    Procedure: Incision and removal of right great toe foreign body. Risks, benefits, alternatives explained to patient. Consent obtained. Time out conducted. Noted no overlying induration or erythema at site of injection. No local anesthesia needed. Alcohol used to clean the site. I made a small incision in the overlying skin using a 22-gauge needle, I was then able to remove the splinter in its entirety. Wound dressed. Advised to return if increased redness, swelling, drainage, fevers, or chills.  Independent interpretation of notes and tests performed by another provider:   None.  Brief History, Exam, Impression, and Recommendations:    Controlled type 2 diabetes mellitus without complication, without long-term current use of insulin ([place]) I had a long discussion today with Dakota Garcia about his new onset diabetes. We discussed screening measures needed, lifestyle changes, pathophysiology. He is amenable to do everything necessary. Adding metformin XR, Ozempic, referral to optometry, urine microalbumin. Aspirin. Pneumococcal 20 vaccination. Return to see me 3 months after starting Ozempic.  Gout attack Recently restarted allopurinol, uric acid should come down nicely.  Hyperlipidemia Triglycerides were elevated, myalgias on multiple statins, he was off of his fenofibrate so we restarted this, this should come down nicely. We can certainly add Zetia and fish oil in the future if needed.  Primary osteoarthritis of both knees Resolved with injection at the last visit.   Splinter of toe Splinter removed from right great toe.  I spent 40 minutes of total time managing this patient today, this includes chart review, face to face, and non-face to face time.  Specifically this was spent discussing the pathophysiology of diabetes, screening measures, treatments needed.  The separate from the time spent removing the foreign body as  above.   ___________________________________________ Gwen Her. Dianah Field, M.D., ABFM., CAQSM. Primary Care and [place] Instructor of [place] of [facility] of Medicine

## 2020-08-17 NOTE — Assessment & Plan Note (Signed)
Triglycerides were elevated, myalgias on multiple statins, he was off of his fenofibrate so we restarted this, this should come down nicely. We can certainly add Zetia and fish oil in the future if needed.

## 2020-08-17 NOTE — Addendum Note (Signed)
Addended by: Andria Rhein L on: 08/17/2020 10:20 AM   Modules accepted: Orders

## 2020-08-17 NOTE — Assessment & Plan Note (Signed)
Splinter removed from right great toe.

## 2020-08-17 NOTE — Assessment & Plan Note (Signed)
Recently restarted allopurinol, uric acid should come down nicely.

## 2020-08-17 NOTE — Assessment & Plan Note (Signed)
Resolved with injection at the last visit 

## 2020-08-17 NOTE — Assessment & Plan Note (Signed)
I had a long discussion today with Dakota Garcia about his new onset diabetes. We discussed screening measures needed, lifestyle changes, pathophysiology. He is amenable to do everything necessary. Adding metformin XR, Ozempic, referral to optometry, urine microalbumin. Aspirin. Pneumococcal 20 vaccination. Return to see me 3 months after starting Ozempic.

## 2020-08-23 DIAGNOSIS — E119 Type 2 diabetes mellitus without complications: Secondary | ICD-10-CM | POA: Diagnosis not present

## 2020-09-06 ENCOUNTER — Encounter: Payer: BC Managed Care – PPO | Admitting: Sports Medicine

## 2020-09-29 ENCOUNTER — Other Ambulatory Visit: Payer: Self-pay | Admitting: Sports Medicine

## 2020-09-29 DIAGNOSIS — E783 Hyperchylomicronemia: Secondary | ICD-10-CM

## 2020-09-29 DIAGNOSIS — F411 Generalized anxiety disorder: Secondary | ICD-10-CM

## 2020-09-29 DIAGNOSIS — E119 Type 2 diabetes mellitus without complications: Secondary | ICD-10-CM

## 2020-09-29 MED ORDER — METFORMIN HCL ER 500 MG PO TB24: 500.0000 mg | ORAL_TABLET | Freq: Every day | ORAL | 3 refills | Status: DC

## 2020-09-29 MED ORDER — ASPIRIN EC 81 MG PO TBEC: 81.0000 mg | DELAYED_RELEASE_TABLET | Freq: Every day | ORAL | 3 refills | Status: DC

## 2020-09-29 MED ORDER — ALLOPURINOL 300 MG PO TABS: 300.0000 mg | ORAL_TABLET | Freq: Every day | ORAL | 3 refills | Status: DC

## 2020-09-29 MED ORDER — OZEMPIC (1 MG/DOSE) 2 MG/1.5ML ~~LOC~~ SOPN
1.0000 mg | PEN_INJECTOR | SUBCUTANEOUS | 11 refills | Status: DC
Start: 2020-09-29 — End: 2021-11-19

## 2020-09-29 MED ORDER — SERTRALINE HCL 100 MG PO TABS: 150.0000 mg | ORAL_TABLET | Freq: Every day | ORAL | 3 refills | Status: DC

## 2020-09-29 MED ORDER — FENOFIBRATE 160 MG PO TABS: 160.0000 mg | ORAL_TABLET | Freq: Every day | ORAL | 3 refills | Status: DC

## 2020-10-10 ENCOUNTER — Other Ambulatory Visit: Payer: Self-pay | Admitting: Sports Medicine

## 2020-11-17 ENCOUNTER — Ambulatory Visit: Payer: BC Managed Care – PPO | Admitting: Sports Medicine

## 2020-11-27 ENCOUNTER — Encounter: Payer: Self-pay | Admitting: Sports Medicine

## 2020-11-27 ENCOUNTER — Ambulatory Visit (INDEPENDENT_AMBULATORY_CARE_PROVIDER_SITE_OTHER): Payer: BC Managed Care – PPO | Admitting: Sports Medicine

## 2020-11-27 ENCOUNTER — Other Ambulatory Visit: Payer: Self-pay

## 2020-11-27 VITALS — BP 112/70 | HR 88 | Ht 68.0 in | Wt 274.0 lb

## 2020-11-27 DIAGNOSIS — E119 Type 2 diabetes mellitus without complications: Secondary | ICD-10-CM | POA: Diagnosis not present

## 2020-11-27 LAB — POCT GLYCOSYLATED HEMOGLOBIN (HGB A1C): HbA1c, POC (controlled diabetic range): 5.6 % (ref 0.0–7.0)

## 2020-11-27 NOTE — Assessment & Plan Note (Signed)
Duane had new onset diabetes with an A1c of 6.9%, he was doing some lifestyle changes already, we added metformin XR, Ozempic, I referred him to optometry and we added a urine microalbumin and aspirin. We also vaccinated him as appropriate for diabetic. A1c is dropped to 5.6%, his eye exam was normal but we do not have a copy of this yet. Return to see me in 6 months.

## 2020-11-27 NOTE — Progress Notes (Signed)
    Procedures performed today:    None.  Independent interpretation of notes and tests performed by another provider:   None.  Brief History, Exam, Impression, and Recommendations:    Controlled type 2 diabetes mellitus without complication, without long-term current use of insulin ([facility]) Dakota Garcia had new onset diabetes with an A1c of 6.9%, he was doing some lifestyle changes already, we added metformin XR, Ozempic, I referred him to optometry and we added a urine microalbumin and aspirin. We also vaccinated him as appropriate for diabetic. A1c is dropped to 5.6%, his eye exam was normal but we do not have a copy of this yet. Return to see me in 6 months.    ___________________________________________ Gwen Her. Dianah Field, M.D., ABFM., CAQSM. Primary Care and [place] Instructor of [place] of [facility] of Medicine

## 2020-12-06 ENCOUNTER — Telehealth: Payer: Self-pay

## 2020-12-06 NOTE — Telephone Encounter (Signed)
Called and scheduled patient for tomorrow with Dakota Garcia, as patient was wanting to get in sooner to be seen instead of waiting to see Dr. Darene Lamer next week. AM

## 2020-12-06 NOTE — Telephone Encounter (Signed)
Wife, Marzetta Board, called to follow up on call since there is a problem with patient's phone. Glenetta Hew that patient needs an appt to access symptoms. Please call Stacy at [phone] to schedule.

## 2020-12-06 NOTE — Telephone Encounter (Signed)
Attempted to reach pt by phone x2.  However, when he would answer the phone he could not hear me speaking.  Will attempt to reach pt again.  Charyl Bigger, CMA

## 2020-12-07 ENCOUNTER — Other Ambulatory Visit: Payer: Self-pay

## 2020-12-07 ENCOUNTER — Ambulatory Visit (INDEPENDENT_AMBULATORY_CARE_PROVIDER_SITE_OTHER): Payer: BC Managed Care – PPO | Admitting: Family Medicine

## 2020-12-07 ENCOUNTER — Encounter: Payer: Self-pay | Admitting: Family Medicine

## 2020-12-07 VITALS — BP 100/66 | HR 80 | Temp 98.3°F | Resp 17

## 2020-12-07 DIAGNOSIS — J40 Bronchitis, not specified as acute or chronic: Secondary | ICD-10-CM | POA: Diagnosis not present

## 2020-12-07 DIAGNOSIS — J329 Chronic sinusitis, unspecified: Secondary | ICD-10-CM

## 2020-12-07 MED ORDER — PREDNISONE 50 MG PO TABS: 50.0000 mg | ORAL_TABLET | Freq: Every day | ORAL | 0 refills | Status: AC

## 2020-12-07 MED ORDER — AZITHROMYCIN 250 MG PO TABS: ORAL_TABLET | ORAL | 0 refills | Status: DC

## 2020-12-07 NOTE — Progress Notes (Signed)
Acute Office Visit  Subjective:    Patient ID: Dakota Garcia, male    DOB: [birth year], 50 y.o.   MRN: [id number]  Chief Complaint  Patient presents with   Sinusitis    Sinusitis This is a new problem. The current episode started in the past 7 days. The problem has been rapidly worsening since onset. The maximum temperature recorded prior to his arrival was 101 - 101.9 F. The fever has been present for Less than 1 day. His pain is at a severity of 8/10. Associated symptoms include chills, congestion, coughing, diaphoresis, ear pain, headaches, neck pain, shortness of breath, sinus pressure, a sore throat and swollen glands. Pertinent negatives include no sneezing. Past treatments include acetaminophen (Mucinex, Nyquil). The treatment provided mild relief.  He has been feeling so bad he can't sleep well. He does have some mild chest tightness, but denies chest pain, dehydration, lethargy, GI/GU symptoms. Cough has become productive with yellow/green sputum, no blood.  Reports he had COVID about 2 weeks ago, started feeling significantly better and went back to work last week. Over the weekend he started feeling bad again and symptoms have worsened fairly quickly. He is miserable today.    Past Medical History:  Diagnosis Date   Anemia    Anxiety    GERD (gastroesophageal reflux disease)    Gout    Hyperlipidemia    Sleep apnea    uses CPAP    Past Surgical History:  Procedure Laterality Date   CERVICAL FUSION     HERNIA REPAIR      Family History  Problem Relation Age of Onset   Cancer Mother        colon   Colon cancer Mother 66   Gout Father    Stroke Maternal Grandmother    COPD Maternal Grandfather    Cancer Paternal Grandmother        lung   Cancer Paternal Grandfather        colon and lung   Colon cancer Paternal Grandfather    Colon cancer Paternal Uncle     Social History   Socioeconomic History   Marital status: Married    Spouse name: Not on file    Number of children: Not on file   Years of education: Not on file   Highest education level: Not on file  Occupational History   Not on file  Tobacco Use   Smoking status: Former    Packs/day: 0.25    Years: 20.00    Pack years: 5.00    Types: Cigarettes   Smokeless tobacco: Current    Types: Snuff  Vaping Use   Vaping Use: Never used  Substance and Sexual Activity   Alcohol use: Yes    Alcohol/week: 10.0 standard drinks    Types: 10 Cans of beer per week   Drug use: No   Sexual activity: Not on file  Other Topics Concern   Not on file  Social History Narrative   Not on file   Social Determinants of Health   Financial Resource Strain: Not on file  Food Insecurity: Not on file  Transportation Needs: Not on file  Physical Activity: Not on file  Stress: Not on file  Social Connections: Not on file  Intimate Partner Violence: Not on file    Outpatient Medications Prior to Visit  Medication Sig Dispense Refill   allopurinol (ZYLOPRIM) 300 MG tablet Take 1 tablet (300 mg total) by mouth daily. 180 tablet 3  aspirin EC 81 MG tablet Take 1 tablet (81 mg total) by mouth daily. 90 tablet 3   fenofibrate 160 MG tablet Take 1 tablet (160 mg total) by mouth daily. 90 tablet 3   metFORMIN (GLUCOPHAGE XR) 500 MG 24 hr tablet Take 1 tablet (500 mg total) by mouth daily with breakfast. 90 tablet 3   Semaglutide, 1 MG/DOSE, (OZEMPIC, 1 MG/DOSE,) 2 MG/1.5ML SOPN Inject 1 mg into the skin once a week. 3 mL 11   sertraline (ZOLOFT) 100 MG tablet Take 1.5 tablets (150 mg total) by mouth daily. 135 tablet 3   No facility-administered medications prior to visit.    Allergies  Allergen Reactions   Penicillins     REACTION: Doesn't know the reaction, but has taken Keflex and Ceftin with no reaction   Statins Other (See Comments)    Myalgias   ROS: All review of systems negative except what is listed in the HPI      Objective:    Physical Exam Vitals reviewed.   Constitutional:      Appearance: Normal appearance.  HENT:     Head: Normocephalic and atraumatic.     Ears:     Comments: Bilateral mid ear effusions    Nose: Nose normal.     Comments: Maxillary sinus pain with palpation    Mouth/Throat:     Mouth: Mucous membranes are moist.     Pharynx: Oropharynx is clear.  Eyes:     Conjunctiva/sclera: Conjunctivae normal.  Cardiovascular:     Rate and Rhythm: Normal rate and regular rhythm.     Pulses: Normal pulses.     Heart sounds: Normal heart sounds.  Pulmonary:     Effort: Pulmonary effort is normal.     Breath sounds: Normal breath sounds.  Abdominal:     General: Bowel sounds are normal. There is no distension.     Palpations: Abdomen is soft.     Tenderness: There is no abdominal tenderness.  Musculoskeletal:        General: Normal range of motion.     Cervical back: Normal range of motion and neck supple. No rigidity or tenderness.  Skin:    General: Skin is warm and dry.     Capillary Refill: Capillary refill takes less than 2 seconds.     Findings: No rash.  Neurological:     General: No focal deficit present.     Mental Status: He is alert and oriented to person, place, and time. Mental status is at baseline.  Psychiatric:        Mood and Affect: Mood normal.        Behavior: Behavior normal.        Thought Content: Thought content normal.        Judgment: Judgment normal.    BP 100/66   Pulse 80   Temp 98.3 F (36.8 C)   Resp 17   SpO2 97%  Wt Readings from Last 3 Encounters:  11/27/20 274 lb (124.3 kg)  08/17/20 280 lb (127 kg)  08/09/20 287 lb (130.2 kg)    Health Maintenance Due  Topic Date Due   PNEUMOCOCCAL POLYSACCHARIDE VACCINE AGE 35-64 HIGH RISK  Never done   COVID-19 Vaccine (1) Never done   Pneumococcal Vaccine 30-45 Years old (1 - PCV) Never done   OPHTHALMOLOGY EXAM  Never done   Hepatitis C Screening  Never done   Zoster Vaccines- Shingrix (1 of 2) Never done    There are no  preventive  care reminders to display for this patient.   Lab Results  Component Value Date   TSH 2.46 08/09/2020   Lab Results  Component Value Date   WBC 5.8 08/09/2020   HGB 13.8 08/09/2020   HCT 40.2 08/09/2020   MCV 93.9 08/09/2020   PLT 206 08/09/2020   Lab Results  Component Value Date   NA 140 08/09/2020   K 4.2 08/09/2020   CO2 23 08/09/2020   GLUCOSE 153 (H) 08/09/2020   BUN 11 08/09/2020   CREATININE 0.78 08/09/2020   BILITOT 0.4 08/09/2020   ALKPHOS 76 05/09/2014   AST 83 (H) 08/09/2020   ALT 89 (H) 08/09/2020   PROT 7.5 08/09/2020   ALBUMIN 4.2 05/09/2014   CALCIUM 9.5 08/09/2020   Lab Results  Component Value Date   CHOL 215 (H) 08/09/2020   Lab Results  Component Value Date   HDL 27 (L) 08/09/2020   Lab Results  Component Value Date   [facility]  08/09/2020     Comment:     . LDL cholesterol not calculated. Triglyceride levels greater than 400 mg/dL invalidate calculated LDL results. . Reference range: <100 . Desirable range <100 mg/dL for primary prevention;   <70 mg/dL for patients with CHD or diabetic patients  with > or = 2 CHD risk factors. Marland Kitchen LDL-C is now calculated using the Martin-Hopkins  calculation, which is a validated novel method providing  better accuracy than the Friedewald equation in the  estimation of LDL-C.  Cresenciano Genre et al. Annamaria Helling. [id number]): 2061-2068  (http://education.[facility].com/faq/FAQ164)    Lab Results  Component Value Date   TRIG 789 (H) 08/09/2020   Lab Results  Component Value Date   CHOLHDL 8.0 (H) 08/09/2020   Lab Results  Component Value Date   HGBA1C 5.6 11/27/2020       Assessment & Plan:   1. Sinobronchitis Given severity of symptoms and double sickening will treat with ABX. Patient is allergic to PCN. Prefers Zpak over doxy. Also sending in prednisone burst. Continue supportive measures - rest, hydration, humidifier, steam showers, warm compresses, OTC cough/cold/analgesics. Patient aware of  signs/symptoms requiring further/urgent evaluation.  - azithromycin (ZITHROMAX Z-PAK) 250 MG tablet; Take 2 tablets (500 mg) on  Day 1,  followed by 1 tablet (250 mg) once daily on Days 2 through 5.  Dispense: 6 tablet; Refill: 0 - predniSONE (DELTASONE) 50 MG tablet; Take 1 tablet (50 mg total) by mouth daily with breakfast for 5 days.  Dispense: 5 tablet; Refill: 0  Follow-up if symptoms worsen or fail to improve.   Purcell Nails Olevia Bowens, DNP, FNP-C

## 2021-03-08 ENCOUNTER — Telehealth: Payer: Self-pay

## 2021-03-08 NOTE — Telephone Encounter (Signed)
Medication: Semaglutide, 1 MG/DOSE, (OZEMPIC, 1 MG/DOSE,) 2 MG/1.5ML SOPN Prior authorization determination received Medication has been approved Approval dates: 02/03/2021-03/05/2022  Patient aware via: voicemail Pharmacy aware: Yes Provider aware via this encounter

## 2021-06-27 ENCOUNTER — Ambulatory Visit (INDEPENDENT_AMBULATORY_CARE_PROVIDER_SITE_OTHER): Payer: BC Managed Care – PPO | Admitting: Sports Medicine

## 2021-06-27 ENCOUNTER — Other Ambulatory Visit: Payer: Self-pay

## 2021-06-27 VITALS — BP 117/79 | HR 67 | Wt 280.1 lb

## 2021-06-27 DIAGNOSIS — E119 Type 2 diabetes mellitus without complications: Secondary | ICD-10-CM

## 2021-06-27 DIAGNOSIS — J40 Bronchitis, not specified as acute or chronic: Secondary | ICD-10-CM | POA: Insufficient documentation

## 2021-06-27 DIAGNOSIS — R058 Other specified cough: Secondary | ICD-10-CM | POA: Diagnosis not present

## 2021-06-27 DIAGNOSIS — J329 Chronic sinusitis, unspecified: Secondary | ICD-10-CM | POA: Insufficient documentation

## 2021-06-27 DIAGNOSIS — E783 Hyperchylomicronemia: Secondary | ICD-10-CM

## 2021-06-27 LAB — POCT UA - MICROALBUMIN
Albumin/Creatinine Ratio, Urine, POC: <30
Creatinine, POC: 200 mg/dL
Microalbumin Ur, POC: 30 mg/L

## 2021-06-27 LAB — POCT GLYCOSYLATED HEMOGLOBIN (HGB A1C): Hemoglobin A1C: 5.6 % (ref 4.0–5.6)

## 2021-06-27 MED ORDER — HYDROCOD POLI-CHLORPHE POLI ER 10-8 MG/5ML PO SUER: 5.0000 mL | Freq: Two times a day (BID) | ORAL | 0 refills | Status: DC | PRN

## 2021-06-27 NOTE — Assessment & Plan Note (Signed)
A1c down to 5.6% on Ozempic, up-to-date on eye exam, diabetic nephropathy screening done today, normal. ?No changes. ?

## 2021-06-27 NOTE — Assessment & Plan Note (Addendum)
Hypertriglyceridemia on Tricor. ?We have not rechecked his lipids since he has restarted Tricor, ordering the labs now. ?If triglycerides still elevated we will add Vascepa as well. ? ?Triglycerides in the 700s on Tricor, adding Vascepa, recheck in 3 months. ?

## 2021-06-27 NOTE — Assessment & Plan Note (Signed)
Dakota Garcia and his family had a viral infection run through the house, symptoms were sinus pressure and cough, most of his symptoms have since resolved but he still has a dry cough, mostly at night, adding Tussionex. ?

## 2021-06-27 NOTE — Progress Notes (Addendum)
? ? ?  Procedures performed today:   ? ?None. ? ?Independent interpretation of notes and tests performed by another provider:  ? ?None. ? ?Brief History, Exam, Impression, and Recommendations:   ? ?Controlled type 2 diabetes mellitus without complication, without long-term current use of insulin ([place]) ?A1c down to 5.6% on Ozempic, up-to-date on eye exam, diabetic nephropathy screening done today, normal. ?No changes. ? ?Hyperlipidemia ?Hypertriglyceridemia on Tricor. ?We have not rechecked his lipids since he has restarted Tricor, ordering the labs now. ?If triglycerides still elevated we will add Vascepa as well. ? ?Triglycerides in the 700s on Tricor, adding Vascepa, recheck in 3 months. ? ?Post-viral cough syndrome ?Dwayne and his family had a viral infection run through the house, symptoms were sinus pressure and cough, most of his symptoms have since resolved but he still has a dry cough, mostly at night, adding Tussionex. ? ? ? ?___________________________________________ ?Gwen Her. Dianah Field, M.D., ABFM., CAQSM. ?Primary Care and Sports Medicine ?[place] ? ?Adjunct Instructor of Family Medicine  ?[employer] ?

## 2021-06-28 ENCOUNTER — Telehealth: Payer: Self-pay

## 2021-06-28 LAB — COMPREHENSIVE METABOLIC PANEL
AG Ratio: 1.5 (calc) (ref 1.0–2.5)
ALT: 31 U/L (ref 9–46)
AST: 28 U/L (ref 10–35)
Albumin: 4.3 g/dL (ref 3.6–5.1)
Alkaline phosphatase (APISO): 81 U/L (ref 35–144)
BUN: 12 mg/dL (ref 7–25)
CO2: 28 mmol/L (ref 20–32)
Calcium: 9.5 mg/dL (ref 8.6–10.3)
Chloride: 102 mmol/L (ref 98–110)
Creat: 0.84 mg/dL (ref 0.70–1.30)
Globulin: 2.9 g/dL (calc) (ref 1.9–3.7)
Glucose, Bld: 132 mg/dL — ABNORMAL HIGH (ref 65–99)
Potassium: 4.3 mmol/L (ref 3.5–5.3)
Sodium: 139 mmol/L (ref 135–146)
Total Bilirubin: 0.4 mg/dL (ref 0.2–1.2)
Total Protein: 7.2 g/dL (ref 6.1–8.1)

## 2021-06-28 LAB — LIPID PANEL
Cholesterol: 204 mg/dL — ABNORMAL HIGH (ref <200)
HDL: 31 mg/dL — ABNORMAL LOW (ref >=40)
Non-HDL Cholesterol (Calc): 173 mg/dL (calc) — ABNORMAL HIGH (ref <130)
Total CHOL/HDL Ratio: 6.6 (calc) — ABNORMAL HIGH (ref <5.0)
Triglycerides: 708 mg/dL — ABNORMAL HIGH (ref <150)

## 2021-06-28 MED ORDER — ICOSAPENT ETHYL 1 G PO CAPS: 2.0000 g | ORAL_CAPSULE | Freq: Two times a day (BID) | ORAL | 11 refills | Status: DC

## 2021-06-28 NOTE — Telephone Encounter (Addendum)
Initiated Prior authorization LFY:[id number] Ethyl 1GM capsules ?Via: Covermymeds ?Case/Key:[id number] ?Status: approved as of 06/28/21 ?Reason:Start Date:05/29/2021;Coverage End Date:06/28/2022 ?Notified Pt via: Mychart ?

## 2021-06-28 NOTE — Addendum Note (Signed)
Addended by: Silverio Decamp on: 06/28/2021 08:58 AM ? ? Modules accepted: Orders ? ?

## 2021-07-11 IMAGING — DX DG KNEE COMPLETE 4+V*R*
4 series · 4 of 4 positions shown · non-contrast
Comparison: January 24, 2020

CLINICAL DATA: LEFT knee swelling and chronic pain with
osteoarthritic changes in both knees.

EXAM:
RIGHT KNEE - COMPLETE 4+ VIEW; LEFT KNEE - COMPLETE 4+ VIEW

[tunnel]
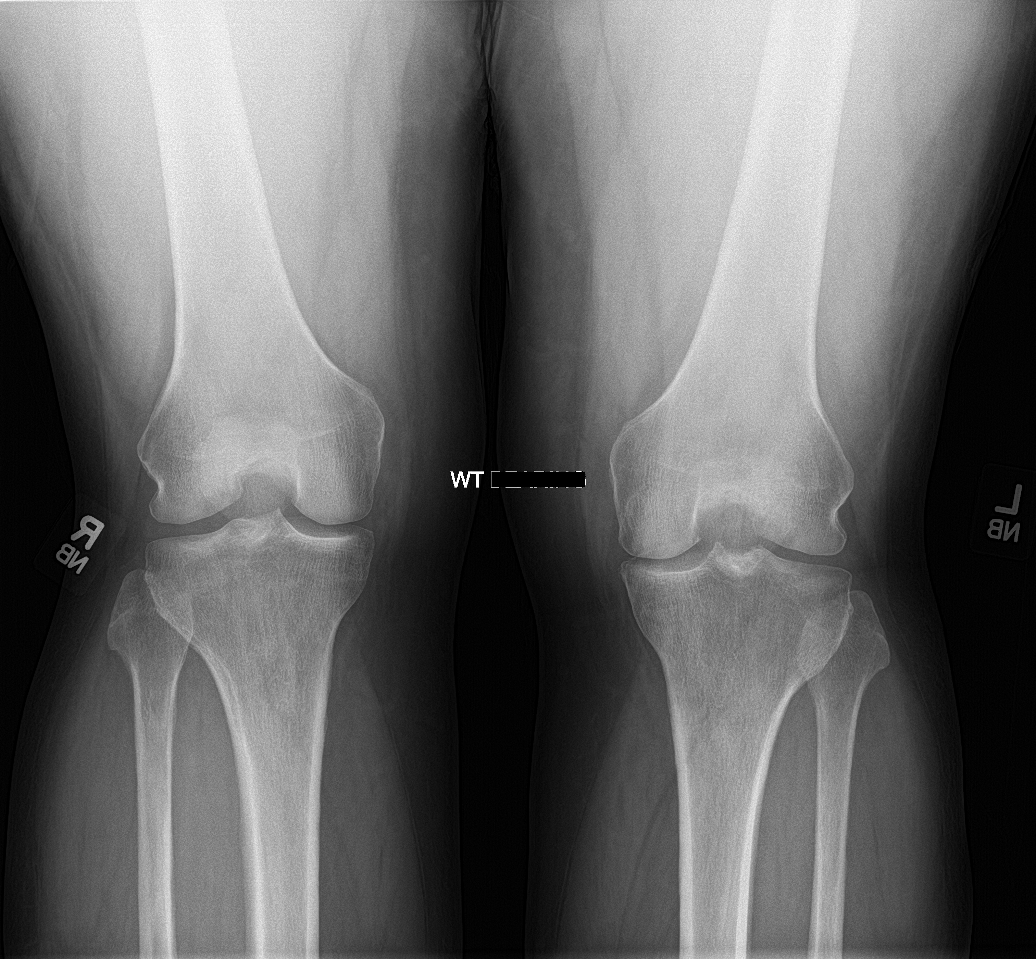

[knee lat]
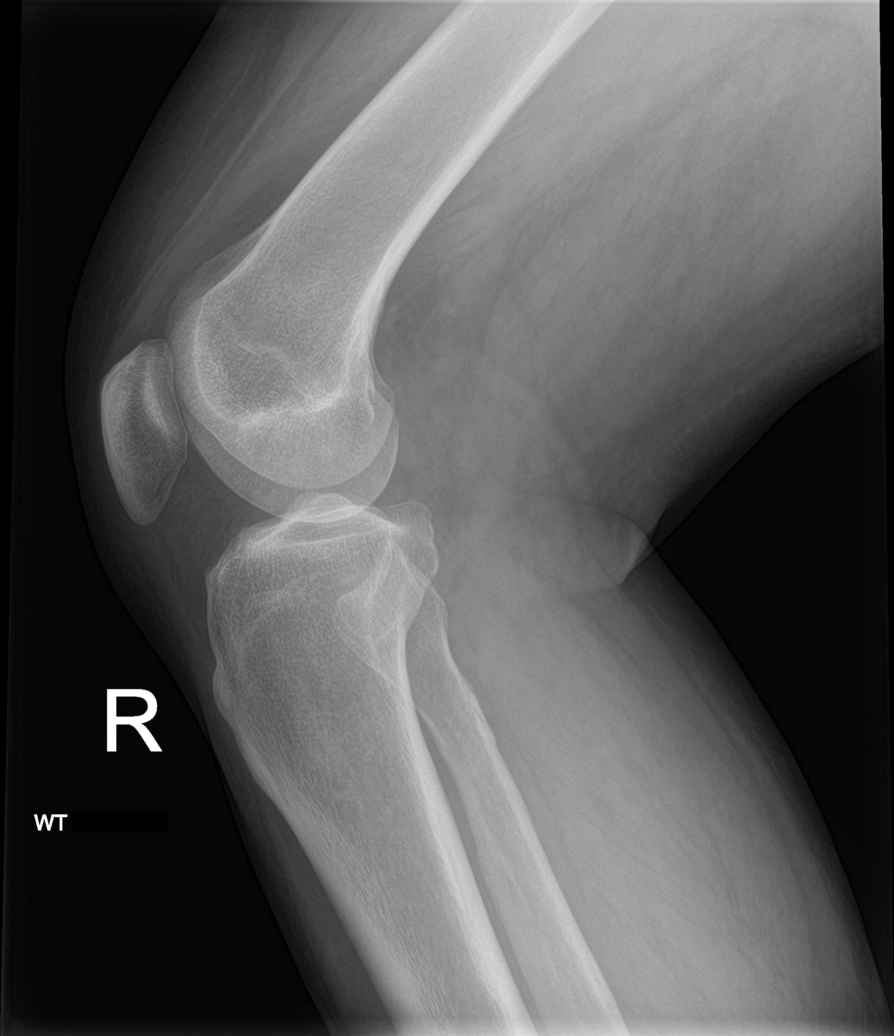

[knee sunrise]
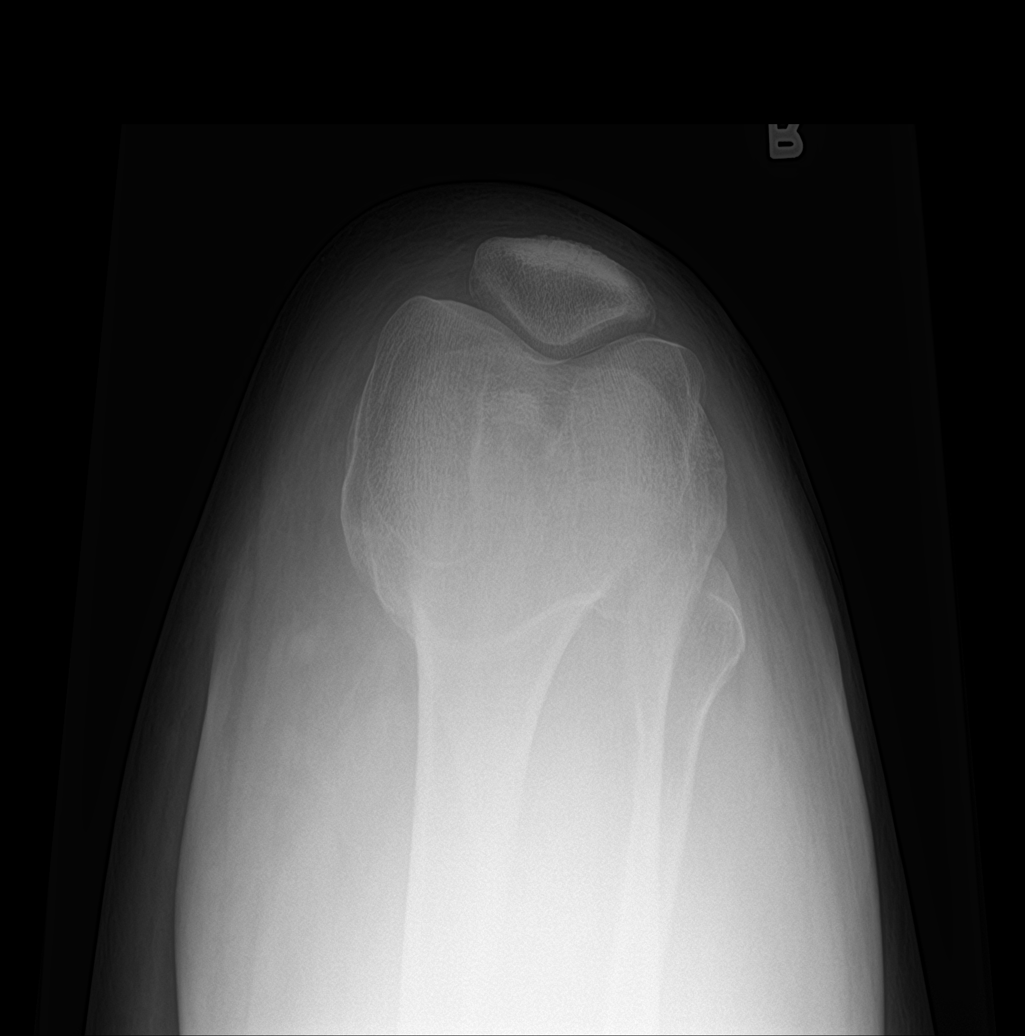

[knee ap bilat standing]
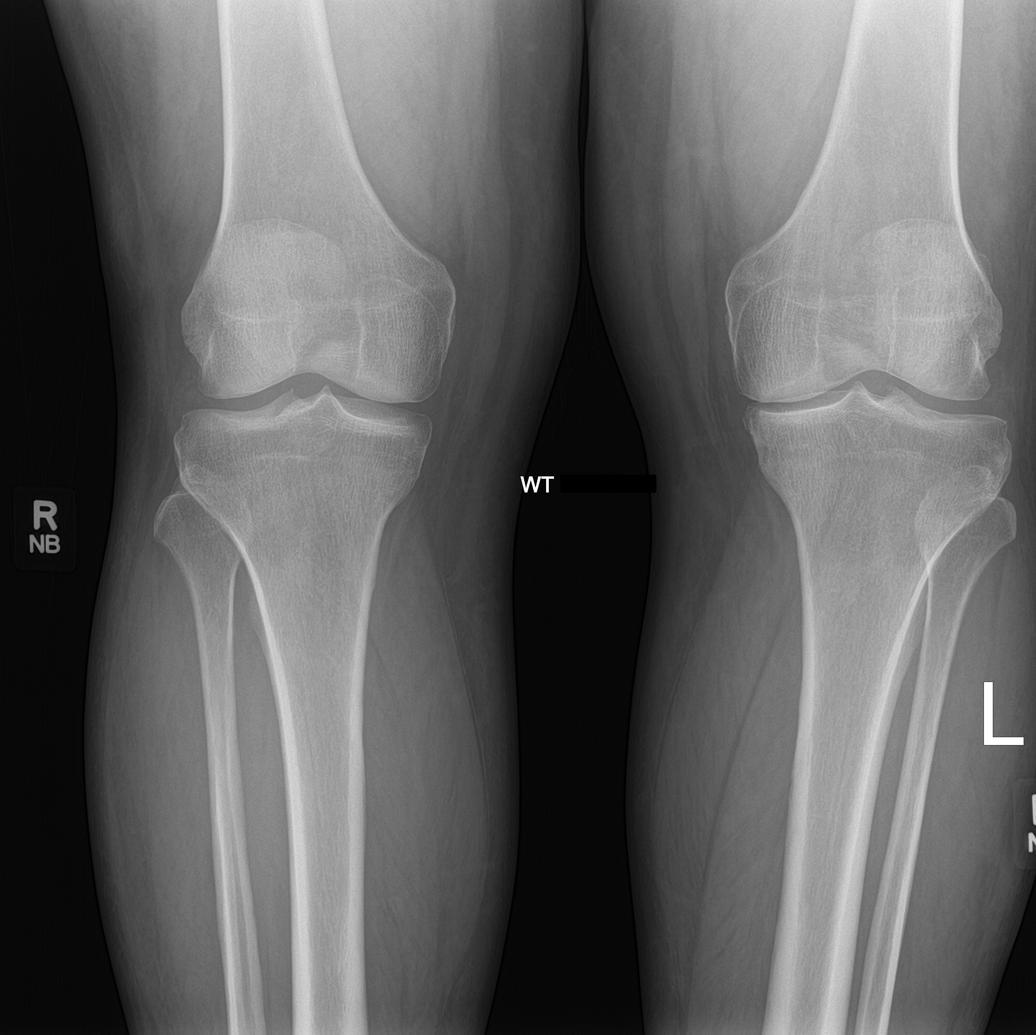

[4 of 4 positions shown; findings below may reference images not displayed]

FINDINGS: RIGHT knee: RIGHT knee with no signs of fracture, dislocation or
joint effusion. No significant arthropathy.

LEFT knee: Upon weight-bearing there is mild to moderate medial
compartment joint space narrowing of the LEFT knee. No sign of joint
effusion. No significant marginal osteophyte formation or
eburnation.
IMPRESSION: Mild medial compartment joint space narrowing on the LEFT without
signs of acute abnormality in the bilateral knees.

## 2021-08-21 ENCOUNTER — Other Ambulatory Visit: Payer: Self-pay | Admitting: Sports Medicine

## 2021-09-17 ENCOUNTER — Other Ambulatory Visit: Payer: Self-pay | Admitting: Sports Medicine

## 2021-11-19 ENCOUNTER — Other Ambulatory Visit: Payer: Self-pay

## 2021-11-19 ENCOUNTER — Other Ambulatory Visit: Payer: Self-pay | Admitting: Sports Medicine

## 2021-11-19 DIAGNOSIS — E119 Type 2 diabetes mellitus without complications: Secondary | ICD-10-CM

## 2021-11-19 DIAGNOSIS — F411 Generalized anxiety disorder: Secondary | ICD-10-CM

## 2021-11-19 DIAGNOSIS — E783 Hyperchylomicronemia: Secondary | ICD-10-CM

## 2021-11-19 MED ORDER — ALLOPURINOL 300 MG PO TABS: 300.0000 mg | ORAL_TABLET | Freq: Every day | ORAL | 1 refills | Status: DC

## 2021-11-19 NOTE — Telephone Encounter (Signed)
Fax from [facility] requesting a new Rx for allopurinol.

## 2022-06-28 ENCOUNTER — Encounter: Payer: Self-pay | Admitting: Sports Medicine

## 2022-06-28 ENCOUNTER — Ambulatory Visit (INDEPENDENT_AMBULATORY_CARE_PROVIDER_SITE_OTHER): Payer: BC Managed Care – PPO | Admitting: Sports Medicine

## 2022-06-28 VITALS — BP 100/63 | HR 91 | Wt 283.0 lb

## 2022-06-28 DIAGNOSIS — M10072 Idiopathic gout, left ankle and foot: Secondary | ICD-10-CM | POA: Diagnosis not present

## 2022-06-28 DIAGNOSIS — E119 Type 2 diabetes mellitus without complications: Secondary | ICD-10-CM | POA: Diagnosis not present

## 2022-06-28 DIAGNOSIS — J329 Chronic sinusitis, unspecified: Secondary | ICD-10-CM

## 2022-06-28 DIAGNOSIS — F411 Generalized anxiety disorder: Secondary | ICD-10-CM

## 2022-06-28 DIAGNOSIS — N139 Obstructive and reflux uropathy, unspecified: Secondary | ICD-10-CM | POA: Diagnosis not present

## 2022-06-28 DIAGNOSIS — E783 Hyperchylomicronemia: Secondary | ICD-10-CM

## 2022-06-28 DIAGNOSIS — E538 Deficiency of other specified B group vitamins: Secondary | ICD-10-CM | POA: Diagnosis not present

## 2022-06-28 DIAGNOSIS — D649 Anemia, unspecified: Secondary | ICD-10-CM

## 2022-06-28 DIAGNOSIS — Z0000 Encounter for general adult medical examination without abnormal findings: Secondary | ICD-10-CM

## 2022-06-28 DIAGNOSIS — J40 Bronchitis, not specified as acute or chronic: Secondary | ICD-10-CM | POA: Diagnosis not present

## 2022-06-28 LAB — POC COVID19 BINAXNOW: SARS Coronavirus 2 Ag: NEGATIVE

## 2022-06-28 LAB — POCT GLYCOSYLATED HEMOGLOBIN (HGB A1C): Hemoglobin A1C: 8.8 % — AB (ref 4.0–5.6)

## 2022-06-28 LAB — POCT INFLUENZA A/B
Influenza A, POC: NEGATIVE
Influenza B, POC: NEGATIVE

## 2022-06-28 LAB — COMPREHENSIVE METABOLIC PANEL: eGFR: 109

## 2022-06-28 MED ORDER — ICOSAPENT ETHYL 1 G PO CAPS: 2.0000 g | ORAL_CAPSULE | Freq: Two times a day (BID) | ORAL | 11 refills | Status: AC

## 2022-06-28 MED ORDER — AZITHROMYCIN 250 MG PO TABS: ORAL_TABLET | ORAL | 0 refills | Status: AC

## 2022-06-28 MED ORDER — ASPIRIN 81 MG PO TBEC: 81.0000 mg | DELAYED_RELEASE_TABLET | Freq: Every day | ORAL | 3 refills | Status: AC

## 2022-06-28 MED ORDER — SERTRALINE HCL 100 MG PO TABS: 150.0000 mg | ORAL_TABLET | Freq: Every day | ORAL | 3 refills | Status: DC

## 2022-06-28 MED ORDER — OZEMPIC (1 MG/DOSE) 4 MG/3ML ~~LOC~~ SOPN: PEN_INJECTOR | SUBCUTANEOUS | 3 refills | Status: DC

## 2022-06-28 MED ORDER — METFORMIN HCL ER 500 MG PO TB24: 500.0000 mg | ORAL_TABLET | Freq: Every day | ORAL | 3 refills | Status: DC

## 2022-06-28 MED ORDER — ALLOPURINOL 300 MG PO TABS: 300.0000 mg | ORAL_TABLET | Freq: Every day | ORAL | 1 refills | Status: DC

## 2022-06-28 MED ORDER — FENOFIBRATE 160 MG PO TABS: 160.0000 mg | ORAL_TABLET | Freq: Every day | ORAL | 3 refills | Status: DC

## 2022-06-28 NOTE — Assessment & Plan Note (Signed)
COVID and flu negative, symptoms present for a couple of weeks. Adding azithromycin, return to see me as needed.

## 2022-06-28 NOTE — Assessment & Plan Note (Signed)
Annual physical as above. 

## 2022-06-28 NOTE — Progress Notes (Signed)
Subjective:    CC: Annual Physical Exam  HPI:  This patient is here for their annual physical  I reviewed the past medical history, family history, social history, surgical history, and allergies today and no changes were needed.  Please see the problem list section below in epic for further details.  Past Medical History: Past Medical History:  Diagnosis Date   Anemia    Anxiety    GERD (gastroesophageal reflux disease)    Gout    Hyperlipidemia    Sleep apnea    uses CPAP   Past Surgical History: Past Surgical History:  Procedure Laterality Date   CERVICAL FUSION     HERNIA REPAIR     Social History: Social History   Socioeconomic History   Marital status: Married    Spouse name: Not on file   Number of children: Not on file   Years of education: Not on file   Highest education level: Not on file  Occupational History   Not on file  Tobacco Use   Smoking status: Former    Packs/day: 0.25    Years: 20.00    Additional pack years: 0.00    Total pack years: 5.00    Types: Cigarettes   Smokeless tobacco: Current    Types: Snuff  Vaping Use   Vaping Use: Never used  Substance and Sexual Activity   Alcohol use: Yes    Alcohol/week: 10.0 standard drinks of alcohol    Types: 10 Cans of beer per week   Drug use: No   Sexual activity: Not on file  Other Topics Concern   Not on file  Social History Narrative   Not on file   Social Determinants of Health   Financial Resource Strain: Not on file  Food Insecurity: Not on file  Transportation Needs: Not on file  Physical Activity: Not on file  Stress: Not on file  Social Connections: Not on file   Family History: Family History  Problem Relation Age of Onset   Cancer Mother        colon   Colon cancer Mother 27   Gout Father    Stroke Maternal Grandmother    COPD Maternal Grandfather    Cancer Paternal Grandmother        lung   Cancer Paternal Grandfather        colon and lung   Colon cancer  Paternal Grandfather    Colon cancer Paternal Uncle    Allergies: Allergies  Allergen Reactions   Penicillins     REACTION: Doesn't know the reaction, but has taken Keflex and Ceftin with no reaction   Statins Other (See Comments)    Myalgias   Medications: See med rec.  Review of Systems: No headache, visual changes, nausea, vomiting, diarrhea, constipation, dizziness, abdominal pain, skin rash, fevers, chills, night sweats, swollen lymph nodes, weight loss, chest pain, body aches, joint swelling, muscle aches, shortness of breath, mood changes, visual or auditory hallucinations.  Objective:    General: Well Developed, well nourished, and in no acute distress.  Neuro: Alert and oriented x3, extra-ocular muscles intact, sensation grossly intact. Cranial nerves II through XII are intact, motor, sensory, and coordinative functions are all intact. HEENT: Normocephalic, atraumatic, pupils equal round reactive to light, neck supple, no masses, no lymphadenopathy, thyroid nonpalpable. Oropharynx, nasopharynx, external ear canals are unremarkable. Skin: Warm and dry, no rashes noted.  Cardiac: Regular rate and rhythm, no murmurs rubs or gallops.  Respiratory: Clear to auscultation bilaterally. Not  using accessory muscles, speaking in full sentences.  Abdominal: Soft, nontender, nondistended, positive bowel sounds, no masses, no organomegaly.  Musculoskeletal: Shoulder, elbow, wrist, hip, knee, ankle stable, and with full range of motion.  Impression and Recommendations:    The patient was counselled, risk factors were discussed, anticipatory guidance given.  Annual physical exam Annual physical as above.  Controlled type 2 diabetes mellitus without complication, without long-term current use of insulin ([place]) Diabetes is uncontrolled, he has been out of Ozempic for 6 months but never told us. We will give an Ozempic starter pen for dose titration and I will refill his Ozempic 1 mg. Adding  labs including urine microalbumin creatinine ratio. I will also set him up for diabetic eye exam downstairs.  Sinobronchitis COVID and flu negative, symptoms present for a couple of weeks. Adding azithromycin, return to see me as needed.   ____________________________________________ Gwen Her. Dianah Field, M.D., ABFM., CAQSM., AME. Primary Care and Sports Medicine [facility]  Adjunct Professor of [place] of [facility] of Medicine  Risk manager

## 2022-06-28 NOTE — Assessment & Plan Note (Addendum)
Diabetes is uncontrolled, he has been out of Ozempic for 6 months but never told us. We will give an Ozempic starter pen for dose titration and I will refill his Ozempic 1 mg. Adding labs including urine microalbumin creatinine ratio. I will also set him up for diabetic eye exam downstairs.

## 2022-07-01 LAB — COMPREHENSIVE METABOLIC PANEL
AG Ratio: 1.4 (calc) (ref 1.0–2.5)
ALT: 59 U/L — ABNORMAL HIGH (ref 9–46)
AST: 48 U/L — ABNORMAL HIGH (ref 10–35)
Albumin: 4.3 g/dL (ref 3.6–5.1)
Alkaline phosphatase (APISO): 96 U/L (ref 35–144)
BUN: 12 mg/dL (ref 7–25)
CO2: 26 mmol/L (ref 20–32)
Calcium: 9.3 mg/dL (ref 8.6–10.3)
Chloride: 101 mmol/L (ref 98–110)
Creat: 0.72 mg/dL (ref 0.70–1.30)
Globulin: 3.1 g/dL (calc) (ref 1.9–3.7)
Glucose, Bld: 211 mg/dL — ABNORMAL HIGH (ref 65–99)
Potassium: 4 mmol/L (ref 3.5–5.3)
Sodium: 139 mmol/L (ref 135–146)
Total Bilirubin: 0.7 mg/dL (ref 0.2–1.2)
Total Protein: 7.4 g/dL (ref 6.1–8.1)

## 2022-07-01 LAB — LIPID PANEL
Cholesterol: 178 mg/dL (ref <200)
HDL: 36 mg/dL — ABNORMAL LOW (ref >=40)
LDL Cholesterol (Calc): 109 mg/dL (calc) — ABNORMAL HIGH
Non-HDL Cholesterol (Calc): 142 mg/dL (calc) — ABNORMAL HIGH (ref <130)
Total CHOL/HDL Ratio: 4.9 (calc) (ref <5.0)
Triglycerides: 209 mg/dL — ABNORMAL HIGH (ref <150)

## 2022-07-01 LAB — CBC
HCT: 39.3 % (ref 38.5–50.0)
Hemoglobin: 13.3 g/dL (ref 13.2–17.1)
MCH: 31.6 pg (ref 27.0–33.0)
MCHC: 33.8 g/dL (ref 32.0–36.0)
MCV: 93.3 fL (ref 80.0–100.0)
MPV: 9.8 fL (ref 7.5–12.5)
Platelets: 193 10*3/uL (ref 140–400)
RBC: 4.21 10*6/uL (ref 4.20–5.80)
RDW: 12.3 % (ref 11.0–15.0)
WBC: 5.8 10*3/uL (ref 3.8–10.8)

## 2022-07-01 LAB — PSA, TOTAL AND FREE
PSA, % Free: 50 % (calc) (ref >25)
PSA, Free: 0.1 ng/mL
PSA, Total: 0.2 ng/mL (ref <=4.0)

## 2022-07-01 LAB — IRON,TIBC AND FERRITIN PANEL
%SAT: 35 % (calc) (ref 20–48)
Ferritin: 556 ng/mL — ABNORMAL HIGH (ref 38–380)
Iron: 125 ug/dL (ref 50–180)
TIBC: 354 mcg/dL (calc) (ref 250–425)

## 2022-07-01 LAB — MICROALBUMIN / CREATININE URINE RATIO
Creatinine, Urine: 274 mg/dL (ref 20–320)
Microalb Creat Ratio: 8 mg/g creat (ref <30)
Microalb, Ur: 2.1 mg/dL

## 2022-07-01 LAB — URIC ACID: Uric Acid, Serum: 4.9 mg/dL (ref 4.0–8.0)

## 2022-07-01 LAB — HEMOGLOBIN A1C
Hgb A1c MFr Bld: 9 % of total Hgb — ABNORMAL HIGH (ref <5.7)
Mean Plasma Glucose: 212 mg/dL
eAG (mmol/L): 11.7 mmol/L

## 2022-07-01 LAB — TSH: TSH: 2.31 mIU/L (ref 0.40–4.50)

## 2022-07-01 LAB — B12 AND FOLATE PANEL
Folate: 11.5 ng/mL
Vitamin B-12: 389 pg/mL (ref 200–1100)

## 2022-08-09 ENCOUNTER — Telehealth: Payer: Self-pay | Admitting: Sports Medicine

## 2022-08-09 DIAGNOSIS — E119 Type 2 diabetes mellitus without complications: Secondary | ICD-10-CM

## 2022-08-09 MED ORDER — OZEMPIC (0.25 OR 0.5 MG/DOSE) 2 MG/1.5ML ~~LOC~~ SOPN: PEN_INJECTOR | SUBCUTANEOUS | 1 refills | Status: DC

## 2022-08-09 NOTE — Telephone Encounter (Signed)
Patient called about PA for Ozempic 0.5mg  he is asking what the status is please advise Thank you

## 2022-08-09 NOTE — Telephone Encounter (Signed)
I do not see that it has even started, I will go ahead and send in the Ozempic 0.5 to see if we can get this show on the road.  I do see him having last taken Ozempic 1 mg.

## 2022-08-21 ENCOUNTER — Telehealth: Payer: Self-pay | Admitting: Sports Medicine

## 2022-08-21 NOTE — Telephone Encounter (Signed)
Patient requests an update on a prior authorization for Ozempic.  Patient stated he is still unable to pick up the prescription. Patient was told to contact the office by the pharmacy.

## 2022-09-03 ENCOUNTER — Ambulatory Visit (INDEPENDENT_AMBULATORY_CARE_PROVIDER_SITE_OTHER): Payer: BC Managed Care – PPO | Admitting: Family Medicine

## 2022-09-03 VITALS — BP 125/84 | HR 90 | Temp 98.1°F | Ht 68.0 in | Wt 282.0 lb

## 2022-09-03 DIAGNOSIS — R062 Wheezing: Secondary | ICD-10-CM | POA: Diagnosis not present

## 2022-09-03 DIAGNOSIS — J019 Acute sinusitis, unspecified: Secondary | ICD-10-CM | POA: Diagnosis not present

## 2022-09-03 DIAGNOSIS — R051 Acute cough: Secondary | ICD-10-CM | POA: Insufficient documentation

## 2022-09-03 DIAGNOSIS — B9689 Other specified bacterial agents as the cause of diseases classified elsewhere: Secondary | ICD-10-CM | POA: Diagnosis not present

## 2022-09-03 MED ORDER — DOXYCYCLINE HYCLATE 100 MG PO TABS: 100.0000 mg | ORAL_TABLET | Freq: Two times a day (BID) | ORAL | 0 refills | Status: AC

## 2022-09-03 MED ORDER — ALBUTEROL SULFATE HFA 108 (90 BASE) MCG/ACT IN AERS: 2.0000 | INHALATION_SPRAY | Freq: Four times a day (QID) | RESPIRATORY_TRACT | 0 refills | Status: AC | PRN

## 2022-09-03 MED ORDER — METHYLPREDNISOLONE 4 MG PO TBPK: ORAL_TABLET | ORAL | 0 refills | Status: DC

## 2022-09-03 MED ORDER — HYDROCOD POLI-CHLORPHE POLI ER 10-8 MG/5ML PO SUER: 5.0000 mL | Freq: Two times a day (BID) | ORAL | 0 refills | Status: AC | PRN

## 2022-09-03 NOTE — Progress Notes (Signed)
Established patient visit   Patient: Dakota Garcia   DOB: [birth year]   52 y.o. Male  MRN: [id number] Visit Date: 09/03/2022  Today's healthcare provider: Charlton Amor, DO   Chief Complaint  Patient presents with   Cough    SUBJECTIVE    Chief Complaint  Patient presents with   Cough   Cough Pertinent negatives include no chest pain, fever or shortness of breath.    Pt presents for acute visit. He has had one month of cough, congestion, and tooth and facial pain.   Review of Systems  Constitutional:  Negative for activity change, fatigue and fever.  HENT:         Facial pain  Respiratory:  Positive for cough. Negative for shortness of breath.   Cardiovascular:  Negative for chest pain.  Gastrointestinal:  Negative for abdominal pain.  Genitourinary:  Negative for difficulty urinating.       Current Meds  Medication Sig   albuterol (VENTOLIN HFA) 108 (90 Base) MCG/ACT inhaler Inhale 2 puffs into the lungs every 6 (six) hours as needed for wheezing or shortness of breath.   allopurinol (ZYLOPRIM) 300 MG tablet Take 1 tablet (300 mg total) by mouth daily.   aspirin EC (ASPIRIN LOW DOSE) 81 MG tablet Take 1 tablet (81 mg total) by mouth daily.   chlorpheniramine-HYDROcodone (TUSSIONEX) 10-8 MG/5ML Take 5 mLs by mouth every 12 (twelve) hours as needed for cough (cough, will cause drowsiness.).   doxycycline (VIBRA-TABS) 100 MG tablet Take 1 tablet (100 mg total) by mouth 2 (two) times daily for 7 days.   fenofibrate 160 MG tablet Take 1 tablet (160 mg total) by mouth daily.   icosapent Ethyl (VASCEPA) 1 g capsule Take 2 capsules (2 g total) by mouth 2 (two) times daily.   metFORMIN (GLUCOPHAGE XR) 500 MG 24 hr tablet Take 1 tablet (500 mg total) by mouth daily with breakfast.   methylPREDNISolone (MEDROL DOSEPAK) 4 MG TBPK tablet Follow instructions on pill pack   Semaglutide, 1 MG/DOSE, (OZEMPIC, 1 MG/DOSE,) 4 MG/3ML SOPN INJECT 1 MG UNDER THE SKIN ONCE A WEEK    Semaglutide,0.25 or 0.5MG /DOS, (OZEMPIC, 0.25 OR 0.5 MG/DOSE,) 2 MG/1.5ML SOPN Inject 0.25 mg subq qwk for 4 wk then inject 0.5 mg subq qwk x4 wk, then go to full dose pen qwk   sertraline (ZOLOFT) 100 MG tablet Take 1.5 tablets (150 mg total) by mouth daily.    OBJECTIVE    BP 125/84   Pulse 90   Temp 98.1 F (36.7 C) (Oral)   Ht 5\' 8"  (1.727 m)   Wt 282 lb (127.9 kg)   SpO2 99%   BMI 42.88 kg/m   Physical Exam Vitals and nursing note reviewed.  Constitutional:      General: He is not in acute distress.    Appearance: Normal appearance.  HENT:     Head: Normocephalic and atraumatic.     Comments: Facial pain    Right Ear: External ear normal.     Left Ear: External ear normal.     Nose: Nose normal.     Mouth/Throat:     Comments: Pt notes tooth pain in molars Eyes:     Conjunctiva/sclera: Conjunctivae normal.  Cardiovascular:     Rate and Rhythm: Normal rate and regular rhythm.  Pulmonary:     Effort: Pulmonary effort is normal.     Breath sounds: Normal breath sounds.  Neurological:     General: No focal  deficit present.     Mental Status: He is alert and oriented to person, place, and time.  Psychiatric:        Mood and Affect: Mood normal.        Behavior: Behavior normal.        Thought Content: Thought content normal.        Judgment: Judgment normal.        ASSESSMENT & PLAN    Problem List Items Addressed This Visit       Respiratory   Acute bacterial sinusitis - Primary    - due to cough duration and symptoms of facial pain and tooth pain will go ahead and treat with doxycycline - pt also feels weak. We will go ahead and do medrol dose pack       Relevant Medications   doxycycline (VIBRA-TABS) 100 MG tablet   methylPREDNISolone (MEDROL DOSEPAK) 4 MG TBPK tablet   chlorpheniramine-HYDROcodone (TUSSIONEX) 10-8 MG/5ML     Other   Wheezing    Pt notes some wheezing, will go ahead and add albuterol inhaler for wheezing associated with cough       Relevant Medications   albuterol (VENTOLIN HFA) 108 (90 Base) MCG/ACT inhaler   Acute cough    - given tussionex  - pmp reviewed and no red flags       Relevant Medications   chlorpheniramine-HYDROcodone (TUSSIONEX) 10-8 MG/5ML    Return in about 1 week (around 09/10/2022).      Meds ordered this encounter  Medications   doxycycline (VIBRA-TABS) 100 MG tablet    Sig: Take 1 tablet (100 mg total) by mouth 2 (two) times daily for 7 days.    Dispense:  14 tablet    Refill:  0   methylPREDNISolone (MEDROL DOSEPAK) 4 MG TBPK tablet    Sig: Follow instructions on pill pack    Dispense:  21 tablet    Refill:  0   albuterol (VENTOLIN HFA) 108 (90 Base) MCG/ACT inhaler    Sig: Inhale 2 puffs into the lungs every 6 (six) hours as needed for wheezing or shortness of breath.    Dispense:  8 g    Refill:  0   chlorpheniramine-HYDROcodone (TUSSIONEX) 10-8 MG/5ML    Sig: Take 5 mLs by mouth every 12 (twelve) hours as needed for cough (cough, will cause drowsiness.).    Dispense:  120 mL    Refill:  0    No orders of the defined types were placed in this encounter.    Charlton Amor, DO  [facility] at [facility] [phone] (phone) [phone] (fax)  [facility]

## 2022-09-03 NOTE — Assessment & Plan Note (Signed)
-   given tussionex  - pmp reviewed and no red flags

## 2022-09-03 NOTE — Assessment & Plan Note (Signed)
-   due to cough duration and symptoms of facial pain and tooth pain will go ahead and treat with doxycycline - pt also feels weak. We will go ahead and do medrol dose pack

## 2022-09-03 NOTE — Assessment & Plan Note (Signed)
Pt notes some wheezing, will go ahead and add albuterol inhaler for wheezing associated with cough

## 2022-09-04 ENCOUNTER — Telehealth: Payer: Self-pay

## 2022-09-04 NOTE — Telephone Encounter (Signed)
Initiated Prior authorization [id number] (1 MG/DOSE) 4MG /3ML pen-injectors  Via: Covermymeds Case/Key:[id number] Status: approved  as of 09/04/22 Reason:Coverage Start Date:08/04/2022;Coverage End Date:09/03/2023; Notified Pt via: Spoke to pt in office handed pt approval letter in hand

## 2022-09-10 ENCOUNTER — Ambulatory Visit (INDEPENDENT_AMBULATORY_CARE_PROVIDER_SITE_OTHER): Payer: BC Managed Care – PPO | Admitting: Sports Medicine

## 2022-09-10 ENCOUNTER — Encounter: Payer: Self-pay | Admitting: Sports Medicine

## 2022-09-10 VITALS — BP 107/72 | HR 99

## 2022-09-10 DIAGNOSIS — Z794 Long term (current) use of insulin: Secondary | ICD-10-CM

## 2022-09-10 DIAGNOSIS — E119 Type 2 diabetes mellitus without complications: Secondary | ICD-10-CM | POA: Diagnosis not present

## 2022-09-10 DIAGNOSIS — J019 Acute sinusitis, unspecified: Secondary | ICD-10-CM | POA: Diagnosis not present

## 2022-09-10 DIAGNOSIS — Z7984 Long term (current) use of oral hypoglycemic drugs: Secondary | ICD-10-CM

## 2022-09-10 DIAGNOSIS — B9689 Other specified bacterial agents as the cause of diseases classified elsewhere: Secondary | ICD-10-CM | POA: Diagnosis not present

## 2022-09-10 NOTE — Assessment & Plan Note (Signed)
This pleasant 51 year old male returns, he was treated aggressively by Dr. Tamera Punt, the treatment was highly appropriate and he has responded dramatically well with doxycycline and a Medrol Dosepak. He is very happy with his outcome and very happy with his treatment by Dr. Tamera Punt.

## 2022-09-10 NOTE — Assessment & Plan Note (Addendum)
Has had some trouble getting Ozempic but recently restarted it a few weeks ago, he does have a physical scheduled for a couple of weeks, I have advised him to do cancel this and reschedule it for 3 months from now so he gets 3 months of Ozempic treatment. At that point we will catch him up on all of his screening including urine microalbumin: Creatinine ratio and diabetic eye examination.

## 2022-09-10 NOTE — Progress Notes (Signed)
    Procedures performed today:    None.  Independent interpretation of notes and tests performed by another provider:   None.  Brief History, Exam, Impression, and Recommendations:    Acute bacterial sinusitis This pleasant 52 year old male returns, he was treated aggressively by Dr. Tamera Punt, the treatment was highly appropriate and he has responded dramatically well with doxycycline and a Medrol Dosepak. He is very happy with his outcome and very happy with his treatment by Dr. Tamera Punt.  Controlled type 2 diabetes mellitus without complication, without long-term current use of insulin (HCC) Has had some trouble getting Ozempic but recently restarted it a few weeks ago, he does have a physical scheduled for a couple of weeks, I have advised him to do cancel this and reschedule it for 3 months from now so he gets 3 months of Ozempic treatment. At that point we will catch him up on all of his screening including urine microalbumin: Creatinine ratio and diabetic eye examination.    ____________________________________________ Ihor Austin. Benjamin Stain, M.D., ABFM., CAQSM., AME. Primary Care and Sports Medicine [facility]  Adjunct Professor of Family Medicine  [place] [employer] [facility] [employer]  Restaurant manager, fast food

## 2022-09-30 ENCOUNTER — Ambulatory Visit: Payer: BC Managed Care – PPO | Admitting: Sports Medicine

## 2022-10-28 ENCOUNTER — Other Ambulatory Visit: Payer: Self-pay | Admitting: Sports Medicine

## 2022-10-28 DIAGNOSIS — F411 Generalized anxiety disorder: Secondary | ICD-10-CM

## 2022-11-07 ENCOUNTER — Other Ambulatory Visit: Payer: Self-pay | Admitting: Sports Medicine

## 2022-11-07 DIAGNOSIS — E119 Type 2 diabetes mellitus without complications: Secondary | ICD-10-CM

## 2022-12-12 ENCOUNTER — Encounter: Payer: Self-pay | Admitting: Sports Medicine

## 2022-12-12 ENCOUNTER — Ambulatory Visit (INDEPENDENT_AMBULATORY_CARE_PROVIDER_SITE_OTHER): Payer: BC Managed Care – PPO | Admitting: Sports Medicine

## 2022-12-12 VITALS — BP 113/70 | HR 103 | Ht 68.0 in | Wt 279.0 lb

## 2022-12-12 DIAGNOSIS — E119 Type 2 diabetes mellitus without complications: Secondary | ICD-10-CM | POA: Diagnosis not present

## 2022-12-12 DIAGNOSIS — K635 Polyp of colon: Secondary | ICD-10-CM | POA: Diagnosis not present

## 2022-12-12 DIAGNOSIS — Z7984 Long term (current) use of oral hypoglycemic drugs: Secondary | ICD-10-CM | POA: Diagnosis not present

## 2022-12-12 LAB — POCT GLYCOSYLATED HEMOGLOBIN (HGB A1C): Hemoglobin A1C: 5.8 % — AB (ref 4.0–5.6)

## 2022-12-12 NOTE — Assessment & Plan Note (Signed)
Dakota Garcia had some difficulty getting Ozempic, he has now been on it consistently for 3 months, 2 mg, A1c today is down to 5.8%. He is tolerating the dosage. He only is now due for diabetic eye exam, referral placed.

## 2022-12-12 NOTE — Progress Notes (Signed)
    Procedures performed today:    None.  Independent interpretation of notes and tests performed by another provider:   None.  Brief History, Exam, Impression, and Recommendations:    Controlled type 2 diabetes mellitus without complication, without long-term current use of insulin (HCC) Dakota Garcia had some difficulty getting Ozempic, he has now been on it consistently for 3 months, 2 mg, A1c today is down to 5.8%. He is tolerating the dosage. He only is now due for diabetic eye exam, referral placed.  Colon polyp Colon polyps noted on colonoscopy in 2021 with a 3-year recall, referral back to Dr. Barron Alvine.  I spent 30 minutes of total time managing this patient today, this includes chart review, face to face, and non-face to face time.  ____________________________________________ Ihor Austin. Benjamin Stain, M.D., ABFM., CAQSM., AME. Primary Care and Sports Medicine [facility]  Adjunct Professor of Family Medicine  [place] [employer] [facility] [employer]  Restaurant manager, fast food

## 2022-12-12 NOTE — Assessment & Plan Note (Signed)
Colon polyps noted on colonoscopy in 2021 with a 3-year recall, referral back to Dr. Barron Alvine.

## 2022-12-31 ENCOUNTER — Encounter: Payer: Self-pay | Admitting: Gastroenterology

## 2023-02-13 ENCOUNTER — Encounter: Payer: Self-pay | Admitting: Gastroenterology

## 2023-06-12 ENCOUNTER — Encounter (INDEPENDENT_AMBULATORY_CARE_PROVIDER_SITE_OTHER): Payer: Self-pay | Admitting: Sports Medicine

## 2023-06-12 ENCOUNTER — Ambulatory Visit (INDEPENDENT_AMBULATORY_CARE_PROVIDER_SITE_OTHER): Payer: BC Managed Care – PPO | Admitting: Sports Medicine

## 2023-06-12 ENCOUNTER — Encounter: Payer: Self-pay | Admitting: Sports Medicine

## 2023-06-12 ENCOUNTER — Ambulatory Visit

## 2023-06-12 VITALS — Wt 276.0 lb

## 2023-06-12 DIAGNOSIS — M25572 Pain in left ankle and joints of left foot: Secondary | ICD-10-CM | POA: Diagnosis not present

## 2023-06-12 DIAGNOSIS — K635 Polyp of colon: Secondary | ICD-10-CM | POA: Diagnosis not present

## 2023-06-12 DIAGNOSIS — E783 Hyperchylomicronemia: Secondary | ICD-10-CM | POA: Diagnosis not present

## 2023-06-12 DIAGNOSIS — E119 Type 2 diabetes mellitus without complications: Secondary | ICD-10-CM | POA: Diagnosis not present

## 2023-06-12 DIAGNOSIS — Z0000 Encounter for general adult medical examination without abnormal findings: Secondary | ICD-10-CM

## 2023-06-12 DIAGNOSIS — Z7985 Long-term (current) use of injectable non-insulin antidiabetic drugs: Secondary | ICD-10-CM

## 2023-06-12 DIAGNOSIS — M7989 Other specified soft tissue disorders: Secondary | ICD-10-CM | POA: Diagnosis not present

## 2023-06-12 DIAGNOSIS — G8929 Other chronic pain: Secondary | ICD-10-CM | POA: Insufficient documentation

## 2023-06-12 DIAGNOSIS — Z7984 Long term (current) use of oral hypoglycemic drugs: Secondary | ICD-10-CM | POA: Diagnosis not present

## 2023-06-12 DIAGNOSIS — M7672 Peroneal tendinitis, left leg: Secondary | ICD-10-CM

## 2023-06-12 DIAGNOSIS — S93402A Sprain of unspecified ligament of left ankle, initial encounter: Secondary | ICD-10-CM | POA: Diagnosis not present

## 2023-06-12 LAB — POCT GLYCOSYLATED HEMOGLOBIN (HGB A1C): Hemoglobin A1C: 5.5 % (ref 4.0–5.6)

## 2023-06-12 NOTE — Patient Instructions (Signed)
 Marland Kitchen

## 2023-06-12 NOTE — Addendum Note (Signed)
 Addended by: Samule Dry on: 06/12/2023 04:25 PM   Modules accepted: Orders

## 2023-06-12 NOTE — Assessment & Plan Note (Signed)
 Declines Shingrix, last physical done Aug 28, 2022.

## 2023-06-12 NOTE — Assessment & Plan Note (Signed)
 This very pleasant 53 year old male returns, initially he had some trouble getting Ozempic, he has now been consistently on it for 6 months, A1c today is down to 5.5%. He is doing well otherwise, he does need an eye exam, urine microalbumin and some of the labs, I will order all of this. He will come back sometime later this week or next week fasting to do the labs.

## 2023-06-12 NOTE — Progress Notes (Signed)
    Procedures performed today:    None.  Independent interpretation of notes and tests performed by another provider:   None.  Brief History, Exam, Impression, and Recommendations:    Controlled type 2 diabetes mellitus without complication, without long-term current use of insulin (HCC) This very pleasant 53 year old male returns, initially he had some trouble getting Ozempic, he has now been consistently on it for 6 months, A1c today is down to 5.5%. He is doing well otherwise, he does need an eye exam, urine microalbumin and some of the labs, I will order all of this. He will come back sometime later this week or next week fasting to do the labs.  Peroneal tendonitis, left He also had an inversion type injury approximately 10 months ago, immediate pain, swelling and difficulty bearing weight, never sought medical attention. Today he continues to have discomfort that he localizes laterally behind the lateral malleolus. On exam he has tenderness along the peroneus brevis, pain with resisted eversion, I will recommend an ankle sleeve, x-rays, home physical therapy, return to see me in about 4 weeks, injection +/- MRI if not better. Suspect peroneus brevis tear.  Annual physical exam Declines Shingrix, last physical done Aug 28, 2022.    ____________________________________________ Ihor Austin. Benjamin Stain, M.D., ABFM., CAQSM., AME. Primary Care and Sports Medicine [facility]  Adjunct Professor of Family Medicine  [place] [employer] [facility] [employer]  Restaurant manager, fast food

## 2023-06-12 NOTE — Assessment & Plan Note (Signed)
 He also had an inversion type injury approximately 10 months ago, immediate pain, swelling and difficulty bearing weight, never sought medical attention. Today he continues to have discomfort that he localizes laterally behind the lateral malleolus. On exam he has tenderness along the peroneus brevis, pain with resisted eversion, I will recommend an ankle sleeve, x-rays, home physical therapy, return to see me in about 4 weeks, injection +/- MRI if not better. Suspect peroneus brevis tear.

## 2023-06-13 NOTE — Telephone Encounter (Signed)

## 2023-06-19 LAB — TSH: TSH: 1.8 u[IU]/mL (ref 0.450–4.500)

## 2023-06-19 LAB — COMPREHENSIVE METABOLIC PANEL WITH GFR
ALT: 20 IU/L (ref 0–44)
Albumin: 4.4 g/dL (ref 3.8–4.9)
BUN/Creatinine Ratio: 12 (ref 9–20)
BUN: 11 mg/dL (ref 6–24)
Calcium: 9.2 mg/dL (ref 8.7–10.2)
Creatinine, Ser: 0.92 mg/dL (ref 0.76–1.27)
Potassium: 4.2 mmol/L (ref 3.5–5.2)
Sodium: 142 mmol/L (ref 134–144)

## 2023-06-19 LAB — LIPID PANEL
Chol/HDL Ratio: 5.8 ratio — ABNORMAL HIGH (ref 0.0–5.0)
Cholesterol, Total: 173 mg/dL (ref 100–199)
HDL: 30 mg/dL — ABNORMAL LOW (ref >39)
LDL Chol Calc (NIH): 112 mg/dL — ABNORMAL HIGH (ref 0–99)
Triglycerides: 172 mg/dL — ABNORMAL HIGH (ref 0–149)
VLDL Cholesterol Cal: 31 mg/dL (ref 5–40)

## 2023-06-19 LAB — COMPREHENSIVE METABOLIC PANEL
AST: 23 IU/L (ref 0–40)
Alkaline Phosphatase: 83 IU/L (ref 44–121)
Bilirubin Total: 0.5 mg/dL (ref 0.0–1.2)
CO2: 25 mmol/L (ref 20–29)
Chloride: 104 mmol/L (ref 96–106)
Globulin, Total: 2.7 g/dL (ref 1.5–4.5)
Glucose: 88 mg/dL (ref 70–99)
Total Protein: 7.1 g/dL (ref 6.0–8.5)
eGFR: 99 mL/min/{1.73_m2} (ref >59)

## 2023-06-19 LAB — PSA, TOTAL AND FREE
PSA, Free Pct: 52.5 %
PSA, Free: 0.21 ng/mL
Prostate Specific Ag, Serum: 0.4 ng/mL (ref 0.0–4.0)

## 2023-06-19 LAB — CBC
Hematocrit: 40.3 % (ref 37.5–51.0)
Hemoglobin: 13.5 g/dL (ref 13.0–17.7)
MCH: 31.5 pg (ref 26.6–33.0)
MCHC: 33.5 g/dL (ref 31.5–35.7)
MCV: 94 fL (ref 79–97)
Platelets: 171 10*3/uL (ref 150–450)
RBC: 4.29 x10E6/uL (ref 4.14–5.80)
RDW: 12.4 % (ref 11.6–15.4)
WBC: 5.5 10*3/uL (ref 3.4–10.8)

## 2023-06-19 LAB — MICROALBUMIN / CREATININE URINE RATIO
Creatinine, Urine: 234.5 mg/dL
Microalb/Creat Ratio: 16 mg/g{creat} (ref 0–29)
Microalbumin, Urine: 36.5 ug/mL

## 2023-07-04 DIAGNOSIS — K621 Rectal polyp: Secondary | ICD-10-CM | POA: Diagnosis not present

## 2023-07-04 DIAGNOSIS — E7800 Pure hypercholesterolemia, unspecified: Secondary | ICD-10-CM | POA: Diagnosis not present

## 2023-07-04 DIAGNOSIS — Z09 Encounter for follow-up examination after completed treatment for conditions other than malignant neoplasm: Secondary | ICD-10-CM | POA: Diagnosis not present

## 2023-07-04 DIAGNOSIS — K635 Polyp of colon: Secondary | ICD-10-CM | POA: Diagnosis not present

## 2023-07-04 DIAGNOSIS — D123 Benign neoplasm of transverse colon: Secondary | ICD-10-CM | POA: Diagnosis not present

## 2023-07-04 DIAGNOSIS — Z1211 Encounter for screening for malignant neoplasm of colon: Secondary | ICD-10-CM | POA: Diagnosis not present

## 2023-07-04 DIAGNOSIS — Z860101 Personal history of adenomatous and serrated colon polyps: Secondary | ICD-10-CM | POA: Diagnosis not present

## 2023-07-09 ENCOUNTER — Other Ambulatory Visit: Payer: Self-pay | Admitting: Sports Medicine

## 2023-07-09 DIAGNOSIS — E119 Type 2 diabetes mellitus without complications: Secondary | ICD-10-CM

## 2023-07-14 ENCOUNTER — Ambulatory Visit (INDEPENDENT_AMBULATORY_CARE_PROVIDER_SITE_OTHER): Admitting: Sports Medicine

## 2023-07-14 DIAGNOSIS — G8929 Other chronic pain: Secondary | ICD-10-CM

## 2023-07-14 DIAGNOSIS — M25572 Pain in left ankle and joints of left foot: Secondary | ICD-10-CM | POA: Diagnosis not present

## 2023-07-14 MED ORDER — MELOXICAM 15 MG PO TABS: ORAL_TABLET | ORAL | 3 refills | Status: AC

## 2023-07-14 NOTE — Progress Notes (Signed)
    Procedures performed today:    None.  Independent interpretation of notes and tests performed by another provider:   None.  Brief History, Exam, Impression, and Recommendations:    Chronic pain of left ankle Dakota Garcia returns, he is a very pleasant 53 year old male, he recalls an inversion injury approximately 11 months ago, he had pain, swelling, difficulty bearing weight, never sought medical attention, ultimately we saw him a month ago, he had some tenderness over the cuboid along the peroneus brevis reproduced with resisted eversion, we recommended compression, x-rays which were negative, physical therapy. At this point he has failed greater than 6 weeks of conservative treatment, he is hurting more over the cuboid today, I would like an MRI of the foot and ankle to evaluate for peroneus brevis split tearing versus a cuboid injury. Meloxicam for pain in the meantime.    ____________________________________________ Dakota Garcia. Dakota Garcia, M.D., ABFM., CAQSM., AME. Primary Care and Sports Medicine [facility]  Adjunct Professor of Family Medicine  [place] [employer] [facility] [employer] Medicine  Restaurant manager, fast food

## 2023-07-14 NOTE — Assessment & Plan Note (Signed)
 Dakota Garcia returns, he is a very pleasant 53 year old male, he recalls an inversion injury approximately 11 months ago, he had pain, swelling, difficulty bearing weight, never sought medical attention, ultimately we saw him a month ago, he had some tenderness over the cuboid along the peroneus brevis reproduced with resisted eversion, we recommended compression, x-rays which were negative, physical therapy. At this point he has failed greater than 6 weeks of conservative treatment, he is hurting more over the cuboid today, I would like an MRI of the foot and ankle to evaluate for peroneus brevis split tearing versus a cuboid injury. Meloxicam for pain in the meantime.

## 2023-07-16 ENCOUNTER — Telehealth: Payer: Self-pay | Admitting: Sports Medicine

## 2023-07-16 NOTE — Telephone Encounter (Signed)
 Thank you :)

## 2023-07-16 NOTE — Telephone Encounter (Signed)
 Received incoming fax from [facility] requesting addition information in order to get MRI approved ,document scanned under Media Tab

## 2023-07-26 ENCOUNTER — Ambulatory Visit

## 2023-07-26 DIAGNOSIS — M25572 Pain in left ankle and joints of left foot: Secondary | ICD-10-CM | POA: Diagnosis not present

## 2023-07-26 DIAGNOSIS — M79672 Pain in left foot: Secondary | ICD-10-CM | POA: Diagnosis not present

## 2023-07-26 DIAGNOSIS — G8929 Other chronic pain: Secondary | ICD-10-CM

## 2023-07-26 DIAGNOSIS — R609 Edema, unspecified: Secondary | ICD-10-CM | POA: Diagnosis not present

## 2023-07-26 DIAGNOSIS — S99912A Unspecified injury of left ankle, initial encounter: Secondary | ICD-10-CM | POA: Diagnosis not present

## 2023-07-26 DIAGNOSIS — M65972 Unspecified synovitis and tenosynovitis, left ankle and foot: Secondary | ICD-10-CM | POA: Diagnosis not present

## 2023-08-19 ENCOUNTER — Ambulatory Visit (INDEPENDENT_AMBULATORY_CARE_PROVIDER_SITE_OTHER): Admitting: Sports Medicine

## 2023-08-19 ENCOUNTER — Other Ambulatory Visit (INDEPENDENT_AMBULATORY_CARE_PROVIDER_SITE_OTHER)

## 2023-08-19 DIAGNOSIS — G8929 Other chronic pain: Secondary | ICD-10-CM

## 2023-08-19 DIAGNOSIS — M7672 Peroneal tendinitis, left leg: Secondary | ICD-10-CM | POA: Diagnosis not present

## 2023-08-19 DIAGNOSIS — M25572 Pain in left ankle and joints of left foot: Secondary | ICD-10-CM

## 2023-08-19 MED ORDER — TRIAMCINOLONE ACETONIDE 40 MG/ML IJ SUSP
40.0000 mg | Freq: Once | INTRAMUSCULAR | Status: AC
Start: 2023-08-19 — End: 2023-08-19
  Administered 2023-08-19: 40 mg via INTRAMUSCULAR

## 2023-08-19 NOTE — Progress Notes (Signed)
    Procedures performed today:    Procedure: Real-time Ultrasound Guided injection of the left peroneal sheath Device: Samsung HS60  Verbal informed consent obtained.  Time-out conducted.  Noted no overlying erythema, induration, or other signs of local infection.  Skin prepped in a sterile fashion.  Local anesthesia: Topical Ethyl chloride.  With sterile technique and under real time ultrasound guidance: Noted peroneal sheath effusion, 1 cc Kenalog  40, 1 cc lidocaine, 1 cc bupivacaine injected easily Completed without difficulty  Advised to call if fevers/chills, erythema, induration, drainage, or persistent bleeding.  Images permanently stored and available for review in PACS.  Impression: Technically successful ultrasound guided injection.  Independent interpretation of notes and tests performed by another provider:   None.  Brief History, Exam, Impression, and Recommendations:    Peroneal tendinitis, left Very pleasant 53 year old male, he recalls an inversion injury about a year ago, he had pain, swelling, difficulty bearing weight but never sought medical attention, on exam initially he had tenderness over the cuboid and the peroneus brevis, this was reproduced with resisted eversion, we added some conservative treatment including compression, physical therapy. Unfortunately he failed physical therapy, we obtained an MRI for procedural planning, multiple pathologic findings were seen however the dominant finding was peroneal tenosynovitis, there was no evidence of peroneal tendon tearing on MRI. Today we made the decision to inject his peroneal sheath, he had immediate pain relief, he will take it easy for about 4 days, and return to see me in 6 weeks.    ____________________________________________ Joselyn Nicely. Sandy Crumb, M.D., ABFM., CAQSM., AME. Primary Care and Sports Medicine [facility]  Adjunct Professor of Family Medicine  [place] [employer] [facility] [employer] Medicine  Restaurant manager, fast food

## 2023-08-19 NOTE — Assessment & Plan Note (Addendum)
 Very pleasant 53 year old male, he recalls an inversion injury about a year ago, he had pain, swelling, difficulty bearing weight but never sought medical attention, on exam initially he had tenderness over the cuboid and the peroneus brevis, this was reproduced with resisted eversion, we added some conservative treatment including compression, physical therapy. Unfortunately he failed physical therapy, we obtained an MRI for procedural planning, multiple pathologic findings were seen however the dominant finding was peroneal tenosynovitis, there was no evidence of peroneal tendon tearing on MRI. Today we made the decision to inject his peroneal sheath, he had immediate pain relief, he will take it easy for about 4 days, and return to see me in 6 weeks.

## 2023-09-30 ENCOUNTER — Ambulatory Visit (INDEPENDENT_AMBULATORY_CARE_PROVIDER_SITE_OTHER): Admitting: Sports Medicine

## 2023-09-30 DIAGNOSIS — M7672 Peroneal tendinitis, left leg: Secondary | ICD-10-CM

## 2023-09-30 DIAGNOSIS — H8110 Benign paroxysmal vertigo, unspecified ear: Secondary | ICD-10-CM | POA: Diagnosis not present

## 2023-09-30 MED ORDER — MECLIZINE HCL 25 MG PO TABS: 25.0000 mg | ORAL_TABLET | Freq: Three times a day (TID) | ORAL | 3 refills | Status: AC | PRN

## 2023-09-30 NOTE — Assessment & Plan Note (Signed)
 Episode of severe vertigo the day after the injection, I do not think this is related to the steroid injection as he has had these injections before without similar symptoms, no tinnitus, changes in hearing, no fullness, no viral symptoms, no trauma. Symptoms were far worse with changing head position, we discussed the anatomy and pathophysiology of benign paroxysmal positional vertigo, we discussed the Epley maneuver, he will do this at home, adding some meclizine for use as needed if this recurs, return as needed.

## 2023-09-30 NOTE — Assessment & Plan Note (Signed)
 Please see prior notes, resolved after peroneal tendon sheath injection

## 2023-09-30 NOTE — Progress Notes (Signed)
    Procedures performed today:    None.  Independent interpretation of notes and tests performed by another provider:   None.  Brief History, Exam, Impression, and Recommendations:    Peroneal tendinitis, left Please see prior notes, resolved after peroneal tendon sheath injection  Benign positional vertigo Episode of severe vertigo the day after the injection, I do not think this is related to the steroid injection as he has had these injections before without similar symptoms, no tinnitus, changes in hearing, no fullness, no viral symptoms, no trauma. Symptoms were far worse with changing head position, we discussed the anatomy and pathophysiology of benign paroxysmal positional vertigo, we discussed the Epley maneuver, he will do this at home, adding some meclizine for use as needed if this recurs, return as needed.    ____________________________________________ Debby PARAS. Curtis, M.D., ABFM., CAQSM., AME. Primary Care and Sports Medicine [facility]  Adjunct Professor of [facility] Medicine  [employer]  Restaurant manager, fast food

## 2023-11-15 ENCOUNTER — Other Ambulatory Visit: Payer: Self-pay | Admitting: Sports Medicine

## 2023-11-15 DIAGNOSIS — E783 Hyperchylomicronemia: Secondary | ICD-10-CM

## 2023-11-17 NOTE — Telephone Encounter (Signed)
 Gave verbal order to [place] at [facility] for patient's prescription.

## 2023-12-09 ENCOUNTER — Encounter: Payer: Self-pay | Admitting: Sports Medicine
